# Patient Record
Sex: Female | Born: 1965
Health system: Southern US, Community
[De-identification: ages and names within clinical notes are randomized; demographics above are authoritative.]

## PROBLEM LIST (undated history)

## (undated) DIAGNOSIS — I10 Essential (primary) hypertension: Secondary | ICD-10-CM

## (undated) DIAGNOSIS — Z21 Asymptomatic human immunodeficiency virus [HIV] infection status: Secondary | ICD-10-CM

## (undated) DIAGNOSIS — D649 Anemia, unspecified: Secondary | ICD-10-CM

## (undated) DIAGNOSIS — Z86010 Personal history of colonic polyps: Secondary | ICD-10-CM

## (undated) DIAGNOSIS — B20 Human immunodeficiency virus [HIV] disease: Secondary | ICD-10-CM

## (undated) DIAGNOSIS — E876 Hypokalemia: Secondary | ICD-10-CM

## (undated) DIAGNOSIS — F419 Anxiety disorder, unspecified: Secondary | ICD-10-CM

## (undated) DIAGNOSIS — E079 Disorder of thyroid, unspecified: Secondary | ICD-10-CM

## (undated) DIAGNOSIS — E860 Dehydration: Secondary | ICD-10-CM

## (undated) HISTORY — DX: Disorder of thyroid, unspecified: E07.9

## (undated) HISTORY — DX: Anxiety disorder, unspecified: F41.9

## (undated) HISTORY — DX: Anemia, unspecified: D64.9

## (undated) HISTORY — DX: Essential (primary) hypertension: I10

## (undated) HISTORY — DX: Personal history of colonic polyps: Z86.010

---

## 2000-10-14 ENCOUNTER — Encounter (INDEPENDENT_AMBULATORY_CARE_PROVIDER_SITE_OTHER): Payer: Self-pay

## 2000-10-14 ENCOUNTER — Other Ambulatory Visit: Admission: RE | Admit: 2000-10-14 | Discharge: 2000-10-14 | Payer: Self-pay | Admitting: Obstetrics and Gynecology

## 2001-08-31 ENCOUNTER — Other Ambulatory Visit: Admission: RE | Admit: 2001-08-31 | Discharge: 2001-08-31 | Payer: Self-pay | Admitting: Obstetrics and Gynecology

## 2002-07-27 ENCOUNTER — Emergency Department (HOSPITAL_COMMUNITY): Admission: EM | Admit: 2002-07-27 | Discharge: 2002-07-27 | Payer: Self-pay | Admitting: Emergency Medicine

## 2002-07-27 ENCOUNTER — Encounter: Payer: Self-pay | Admitting: Emergency Medicine

## 2003-08-15 ENCOUNTER — Other Ambulatory Visit: Admission: RE | Admit: 2003-08-15 | Discharge: 2003-08-15 | Payer: Self-pay | Admitting: Obstetrics and Gynecology

## 2004-11-12 ENCOUNTER — Ambulatory Visit: Payer: Self-pay | Admitting: Family Medicine

## 2004-11-12 ENCOUNTER — Other Ambulatory Visit: Admission: RE | Admit: 2004-11-12 | Discharge: 2004-11-12 | Payer: Self-pay | Admitting: Family Medicine

## 2004-11-28 ENCOUNTER — Ambulatory Visit: Payer: Self-pay | Admitting: Family Medicine

## 2004-12-24 ENCOUNTER — Ambulatory Visit: Payer: Self-pay | Admitting: Family Medicine

## 2005-01-09 ENCOUNTER — Ambulatory Visit: Payer: Self-pay | Admitting: Family Medicine

## 2005-06-10 ENCOUNTER — Ambulatory Visit: Payer: Self-pay | Admitting: Family Medicine

## 2005-12-07 ENCOUNTER — Encounter: Payer: Self-pay | Admitting: Family Medicine

## 2005-12-08 ENCOUNTER — Encounter: Admission: RE | Admit: 2005-12-08 | Discharge: 2005-12-08 | Payer: Self-pay | Admitting: Ophthalmology

## 2005-12-09 ENCOUNTER — Encounter: Admission: RE | Admit: 2005-12-09 | Discharge: 2005-12-09 | Payer: Self-pay | Admitting: Occupational Medicine

## 2005-12-19 ENCOUNTER — Ambulatory Visit: Payer: Self-pay | Admitting: Family Medicine

## 2006-01-13 ENCOUNTER — Ambulatory Visit: Payer: Self-pay | Admitting: Family Medicine

## 2006-04-09 ENCOUNTER — Encounter (INDEPENDENT_AMBULATORY_CARE_PROVIDER_SITE_OTHER): Payer: Self-pay | Admitting: Specialist

## 2006-04-09 ENCOUNTER — Inpatient Hospital Stay (HOSPITAL_COMMUNITY): Admission: RE | Admit: 2006-04-09 | Discharge: 2006-04-10 | Payer: Self-pay | Admitting: Obstetrics and Gynecology

## 2006-04-09 HISTORY — PX: ABDOMINAL HYSTERECTOMY: SHX81

## 2006-06-09 LAB — CONVERTED CEMR LAB: Pap Smear: NORMAL

## 2006-07-09 ENCOUNTER — Ambulatory Visit: Payer: Self-pay | Admitting: Family Medicine

## 2006-10-16 ENCOUNTER — Ambulatory Visit: Payer: Self-pay | Admitting: Family Medicine

## 2006-10-16 ENCOUNTER — Encounter: Payer: Self-pay | Admitting: Family Medicine

## 2006-10-16 DIAGNOSIS — L309 Dermatitis, unspecified: Secondary | ICD-10-CM | POA: Insufficient documentation

## 2006-10-16 DIAGNOSIS — E049 Nontoxic goiter, unspecified: Secondary | ICD-10-CM | POA: Insufficient documentation

## 2006-10-16 DIAGNOSIS — D259 Leiomyoma of uterus, unspecified: Secondary | ICD-10-CM | POA: Insufficient documentation

## 2006-10-16 DIAGNOSIS — J309 Allergic rhinitis, unspecified: Secondary | ICD-10-CM | POA: Insufficient documentation

## 2006-10-16 DIAGNOSIS — D509 Iron deficiency anemia, unspecified: Secondary | ICD-10-CM | POA: Insufficient documentation

## 2006-10-16 DIAGNOSIS — F411 Generalized anxiety disorder: Secondary | ICD-10-CM | POA: Insufficient documentation

## 2006-10-16 DIAGNOSIS — J45909 Unspecified asthma, uncomplicated: Secondary | ICD-10-CM | POA: Insufficient documentation

## 2006-10-16 DIAGNOSIS — I10 Essential (primary) hypertension: Secondary | ICD-10-CM | POA: Insufficient documentation

## 2006-10-16 DIAGNOSIS — M722 Plantar fascial fibromatosis: Secondary | ICD-10-CM | POA: Insufficient documentation

## 2006-10-16 DIAGNOSIS — M109 Gout, unspecified: Secondary | ICD-10-CM | POA: Insufficient documentation

## 2006-10-16 DIAGNOSIS — E0500 Thyrotoxicosis with diffuse goiter without thyrotoxic crisis or storm: Secondary | ICD-10-CM | POA: Insufficient documentation

## 2006-12-24 ENCOUNTER — Ambulatory Visit: Payer: Self-pay | Admitting: Family Medicine

## 2006-12-24 DIAGNOSIS — F17200 Nicotine dependence, unspecified, uncomplicated: Secondary | ICD-10-CM | POA: Insufficient documentation

## 2006-12-25 LAB — CONVERTED CEMR LAB
ALT: 11 units/L (ref 0–35)
Albumin: 3.7 g/dL (ref 3.5–5.2)
Basophils Relative: 0.3 % (ref 0.0–1.0)
Calcium: 9.3 mg/dL (ref 8.4–10.5)
Chloride: 103 meq/L (ref 96–112)
Creatinine, Ser: 0.8 mg/dL (ref 0.4–1.2)
Eosinophils Absolute: 0.2 10*3/uL (ref 0.0–0.6)
Glucose, Bld: 63 mg/dL — ABNORMAL LOW (ref 70–99)
HCT: 37.9 % (ref 36.0–46.0)
HDL: 32.1 mg/dL — ABNORMAL LOW (ref >39.0)
MCHC: 34.7 g/dL (ref 30.0–36.0)
Monocytes Absolute: 0.6 10*3/uL (ref 0.2–0.7)
Neutro Abs: 3.4 10*3/uL (ref 1.4–7.7)
RBC: 4.2 M/uL (ref 3.87–5.11)
RDW: 12.9 % (ref 11.5–14.6)
Sodium: 141 meq/L (ref 135–145)
Total CHOL/HDL Ratio: 5
Total Protein: 7.1 g/dL (ref 6.0–8.3)

## 2007-01-05 ENCOUNTER — Telehealth (INDEPENDENT_AMBULATORY_CARE_PROVIDER_SITE_OTHER): Payer: Self-pay | Admitting: *Deleted

## 2007-05-27 ENCOUNTER — Encounter: Payer: Self-pay | Admitting: Family Medicine

## 2008-06-07 ENCOUNTER — Ambulatory Visit: Payer: Self-pay | Admitting: Family Medicine

## 2008-06-07 DIAGNOSIS — E039 Hypothyroidism, unspecified: Secondary | ICD-10-CM | POA: Insufficient documentation

## 2008-06-08 LAB — CONVERTED CEMR LAB
BUN: 13 mg/dL (ref 6–23)
CO2: 30 meq/L (ref 19–32)
Calcium: 9.5 mg/dL (ref 8.4–10.5)
Creatinine, Ser: 0.7 mg/dL (ref 0.4–1.2)
Free T4: 0.7 ng/dL (ref 0.6–1.6)
GFR calc Af Amer: 118 mL/min
GFR calc non Af Amer: 98 mL/min
Sodium: 138 meq/L (ref 135–145)

## 2009-07-13 ENCOUNTER — Encounter: Payer: Self-pay | Admitting: Family Medicine

## 2009-07-13 ENCOUNTER — Emergency Department (HOSPITAL_COMMUNITY): Admission: EM | Admit: 2009-07-13 | Discharge: 2009-07-13 | Payer: Self-pay | Admitting: Emergency Medicine

## 2009-07-18 ENCOUNTER — Ambulatory Visit: Payer: Self-pay | Admitting: Family Medicine

## 2009-07-18 DIAGNOSIS — R002 Palpitations: Secondary | ICD-10-CM | POA: Insufficient documentation

## 2009-07-18 DIAGNOSIS — K3189 Other diseases of stomach and duodenum: Secondary | ICD-10-CM | POA: Insufficient documentation

## 2009-07-18 DIAGNOSIS — R1013 Epigastric pain: Secondary | ICD-10-CM

## 2009-07-19 LAB — CONVERTED CEMR LAB: T3 Uptake Ratio: 36.8 % (ref 22.5–37.0)

## 2009-10-29 ENCOUNTER — Ambulatory Visit: Payer: Self-pay | Admitting: Family Medicine

## 2009-10-29 LAB — CONVERTED CEMR LAB
ALT: 12 units/L (ref 0–35)
Albumin: 4.3 g/dL (ref 3.5–5.2)
Basophils Absolute: 0 10*3/uL (ref 0.0–0.1)
Bilirubin, Direct: 0.1 mg/dL (ref 0.0–0.3)
CO2: 31 meq/L (ref 19–32)
Chloride: 101 meq/L (ref 96–112)
GFR calc non Af Amer: 129.79 mL/min (ref >60)
HCT: 38.8 % (ref 36.0–46.0)
HDL: 42.2 mg/dL (ref >39.00)
Hemoglobin: 13.2 g/dL (ref 12.0–15.0)
Lymphocytes Relative: 25.5 % (ref 12.0–46.0)
MCHC: 34.1 g/dL (ref 30.0–36.0)
MCV: 93 fL (ref 78.0–100.0)
Monocytes Relative: 7.6 % (ref 3.0–12.0)
Neutro Abs: 4.2 10*3/uL (ref 1.4–7.7)
Platelets: 259 10*3/uL (ref 150.0–400.0)
Potassium: 4 meq/L (ref 3.5–5.1)
Sodium: 139 meq/L (ref 135–145)
TSH: 4.82 microintl units/mL (ref 0.35–5.50)
VLDL: 10 mg/dL (ref 0.0–40.0)
WBC: 6.5 10*3/uL (ref 4.5–10.5)

## 2009-10-31 ENCOUNTER — Ambulatory Visit: Payer: Self-pay | Admitting: Family Medicine

## 2010-02-15 ENCOUNTER — Ambulatory Visit: Payer: Self-pay | Admitting: Family Medicine

## 2010-04-10 ENCOUNTER — Encounter: Admission: RE | Admit: 2010-04-10 | Discharge: 2010-04-10 | Payer: Self-pay | Admitting: Family Medicine

## 2010-04-16 ENCOUNTER — Encounter: Payer: Self-pay | Admitting: Family Medicine

## 2010-04-30 ENCOUNTER — Encounter (INDEPENDENT_AMBULATORY_CARE_PROVIDER_SITE_OTHER): Payer: Self-pay | Admitting: *Deleted

## 2010-06-12 ENCOUNTER — Other Ambulatory Visit: Payer: Self-pay | Admitting: Family Medicine

## 2010-06-12 ENCOUNTER — Ambulatory Visit
Admission: RE | Admit: 2010-06-12 | Discharge: 2010-06-12 | Payer: Self-pay | Source: Home / Self Care | Attending: Family Medicine | Admitting: Family Medicine

## 2010-06-12 DIAGNOSIS — E7800 Pure hypercholesterolemia, unspecified: Secondary | ICD-10-CM | POA: Insufficient documentation

## 2010-06-12 LAB — AST: AST: 16 U/L (ref 0–37)

## 2010-06-12 LAB — LIPID PANEL
Cholesterol: 180 mg/dL (ref 0–200)
HDL: 36 mg/dL — ABNORMAL LOW (ref >39.00)
LDL Cholesterol: 133 mg/dL — ABNORMAL HIGH (ref 0–99)
Total CHOL/HDL Ratio: 5
Triglycerides: 54 mg/dL (ref 0.0–149.0)
VLDL: 10.8 mg/dL (ref 0.0–40.0)

## 2010-06-12 LAB — ALT: ALT: 10 U/L (ref 0–35)

## 2010-06-19 ENCOUNTER — Ambulatory Visit
Admission: RE | Admit: 2010-06-19 | Discharge: 2010-06-19 | Payer: Self-pay | Source: Home / Self Care | Attending: Family Medicine | Admitting: Family Medicine

## 2010-06-30 ENCOUNTER — Encounter: Payer: Self-pay | Admitting: Family Medicine

## 2010-07-11 NOTE — Assessment & Plan Note (Signed)
Summary: ?SINUS INFECTION/CLE   Vital Signs:  Patient profile:   45 year old female Height:      66 inches Weight:      212 pounds BMI:     34.34 Temp:     97.9 degrees F oral Pulse rate:   72 / minute Pulse rhythm:   regular BP sitting:   124 / 82  (left arm) Cuff size:   large  Vitals Entered By: Lewanda Rife LPN (February 15, 2010 9:03 AM) CC: ?sinius infection, sinus congested and drainage at back of throat   History of Present Illness: thinks she has a sinus infection some symptoms since may  was exp to a gas at her job for 3 months and started using afrin and generic versions of that for a while  is still using   really congested  is having drainage and some colored phlegm  head pressure and pain in face  some sore throat   no n/v/d   is smoking 1 pk per 4 days  has not tried patches chantix made her depressed    Allergies: 1)  ! Codeine 2)  ! Chantix  Past History:  Past Medical History: Last updated: 07/18/2009 Anemia-iron deficiency Anxiety Asthma Gout Hypertension Hyperthyroidism quit smoking 1/11  Past Surgical History: Last updated: 10/31/2009 Caesarean section Hysterectomy- (04/2006)- partial for fibroids   Family History: Last updated: 12/24/2006 Father: Brain anyerism- deceased Mother: pulmonary embolus, deceased Siblings:  uncle CAD MGM b ca HTN in family DM in distant family brother- lung ca  Social History: Last updated: 10/31/2009 Marital Status: Married Children: 2 daughters Occupation: pt. care tech- dialysis therapist  former smoker age [age] (5/11 -- started smoking again )   Risk Factors: Smoking Status: current (10/16/2006)  Review of Systems General:  Complains of fatigue, fever, and malaise. Eyes:  Denies blurring and eye irritation. ENT:  Complains of nasal congestion, postnasal drainage, sinus pressure, and sore throat. CV:  Denies chest pain or discomfort, lightheadness, and palpitations. Resp:   Complains of cough and sputum productive; denies shortness of breath and wheezing. GI:  Denies abdominal pain, nausea, and vomiting. Derm:  Denies itching, lesion(s), poor wound healing, and rash. Neuro:  Complains of headaches; denies numbness and tingling.  Physical Exam  General:  Well-developed,well-nourished,in no acute distress; alert,appropriate and cooperative throughout examination Head:  normocephalic, atraumatic, and no abnormalities observed.  very mild maxillary sinus tenderness Eyes:  vision grossly intact, pupils equal, pupils round, and pupils reactive to light.  no conjunctival pallor, injection or icterus  Ears:  R ear normal and L ear normal.   Nose:  nares are injected and congested bilaterally  Mouth:  pharynx pink and moist, no erythema, and no exudates.   Neck:  No deformities, masses, or tenderness noted. Chest Wall:  No deformities, masses, or tenderness noted. Lungs:  mildly distant bs without wheeze or prol exp phase  Heart:  Normal rate and regular rhythm. S1 and S2 normal without gallop, murmur, click, rub or other extra sounds. Skin:  Intact without suspicious lesions or rashes Cervical Nodes:  No lymphadenopathy noted Psych:  normal affect, talkative and pleasant    Impression & Recommendations:  Problem # 1:  SINUSITIS - ACUTE-NOS (ICD-461.9) Assessment New  after uri and chem exp and dependence on afrin recommend sympt care- see pt instructions  will stop afrin tx with augmentin and flonase  pt advised to update me if symptoms worsen or do not improve  Her updated medication list  for this problem includes:    Augmentin 875-125 Mg Tabs (Amoxicillin-pot clavulanate) .Marland Kitchen... 1 by mouth two times a day for 10 days    Flonase 50 Mcg/act Susp (Fluticasone propionate) .Marland Kitchen... 2 sprays in each nostril once daily  Orders: Prescription Created Electronically [id number])  Problem # 2:  SMOKER (ICD-305.1) Assessment: Unchanged intent on quitting will try  nicotine patches otc and update discussed in detail risks of smoking, and possible outcomes including COPD, vascular dz, cancer and also respiratory infections/sinus problems  - pt aware   Complete Medication List: 1)  Hydrochlorothiazide 25 Mg Tabs (Hydrochlorothiazide) .... Take one by mouth each am 2)  Omeprazole 20 Mg Cpdr (Omeprazole) .Marland Kitchen.. 1 by mouth once daily in am 3)  Ibuprofen 200 Mg Tabs (Ibuprofen) .... Otc as directed. 4)  Augmentin 875-125 Mg Tabs (Amoxicillin-pot clavulanate) .Marland Kitchen.. 1 by mouth two times a day for 10 days 5)  Flonase 50 Mcg/act Susp (Fluticasone propionate) .... 2 sprays in each nostril once daily  Patient Instructions: 1)  try nicotine patches otc 2)  take augmentin for sinus infection 3)  try flonase for congestion 4)  stop otc nasal sprays completely  5)  you can try mucinex for congestion and drink lots of water 6)  I sent px to pharmacy Prescriptions: FLONASE 50 MCG/ACT SUSP (FLUTICASONE PROPIONATE) 2 sprays in each nostril once daily  #1 mdi x 11   Entered and Authorized by:   Judith Part MD   Signed by:   Judith Part MD on 02/15/2010   Method used:   Electronically to        [facility] (retail)       [address]       [place], [place]  [address]       Ph: [phone]       Fax: [phone]   RxID:   [id number] AUGMENTIN 875-125 MG TABS (AMOXICILLIN-POT CLAVULANATE) 1 by mouth two times a day for 10 days  #20 x 0   Entered and Authorized by:   Judith Part MD   Signed by:   Judith Part MD on 02/15/2010   Method used:   Electronically to        [facility] (retail)       [address]       [place], [place]  [address]       Ph: [phone]       Fax: [phone]   RxID:   [id number]   Current Allergies (reviewed today): ! CODEINE ! CHANTIX

## 2010-07-11 NOTE — Assessment & Plan Note (Signed)
Summary: HEART PALPITATIONS,JITTERY/CLE   Vital Signs:  Patient profile:   45 year old female Height:      66 inches Weight:      212.25 pounds BMI:     34.38 Temp:     98.7 degrees F oral Pulse rate:   72 / minute Pulse rhythm:   regular BP sitting:   122 / 90  (left arm) Cuff size:   large  Vitals Entered By: Lewanda Rife LPN (July 18, 2009 10:21 AM)  History of Present Illness: here for heart palp seen in [facility] on 2/4  around first of january - felt like her K was getting low - so she inc high K foods  heart palpitations -- hr up and down jitters and feeling panicy on the job  extra fatigue - but no hair loss not sleeping / afraid to go to sleep also having chest pains -- like a knawing sensation  when she burps some relief  did eat out 3 times on one weekend -- this may have added to it -- felt horrible   K level and other labs in hosp -- was nl   some sinus problems - after a cold   QUIT SMOKING-- is really happy about that 1 mo   stress  finished her degree 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 Codeine  Past History:  Past Surgical History: Last updated: 10/16/2006 Caesarean section Hysterectomy- (04/2006)  Family History: Last updated: 12/24/2006 Father: Brain anyerism- deceased Mother: pulmonary embolus, deceased Siblings:  uncle CAD MGM b ca HTN in family DM in distant family brother- lung ca  Social History: Last updated: 07/18/2009 Marital Status: Married Children: 2 daughters Occupation: pt. care tech- dialysis therapist  former smoker age [age]   Risk Factors: Smoking Status: current (10/16/2006)  Past Medical History: Anemia-iron deficiency Anxiety Asthma Gout Hypertension Hyperthyroidism quit smoking 1/11  Social  History: Marital Status: Married Children: 2 daughters Occupation: pt. care tech- dialysis therapist  former smoker age [age]   Review of Systems General:  Denies fatigue, fever, loss of appetite, and malaise. Eyes:  Denies blurring and eye irritation. CV:  Complains of palpitations; denies chest pain or discomfort and lightheadness. Resp:  Denies cough and wheezing. GI:  Complains of gas and indigestion; denies abdominal pain, bloody stools, and change in bowel habits. MS:  Denies muscle aches and cramps. Derm:  Denies itching, lesion(s), poor wound healing, and rash. Neuro:  Complains of tremors; denies headaches, numbness, tingling, visual disturbances, and weakness. Psych:  Complains of anxiety; denies sense of great danger and suicidal thoughts/plans. Endo:  Denies cold intolerance, excessive thirst, excessive urination, and heat intolerance. Heme:  Denies abnormal bruising and bleeding.  Physical Exam  General:  overweight but generally well appearing  Head:  normocephalic, atraumatic, and no abnormalities observed.   Eyes:  vision grossly intact, pupils equal, pupils round, pupils reactive to light, and no injection.   Ears:  R ear normal and L ear normal.   Nose:  no nasal discharge.   Mouth:  pharynx pink and moist.   Neck:  nl rom  no bruits or jvd no change in thyroid exam  Chest Wall:  No deformities, masses,  or tenderness noted. Lungs:  Normal respiratory effort, chest expands symmetrically. Lungs are clear to auscultation, no crackles or wheezes. Heart:  Normal rate and regular rhythm. S1 and S2 normal without gallop, murmur, click, rub or other extra sounds. Abdomen:  mild epigastric tenderness without rebound or gaurding  soft, normal bowel sounds, no distention, no masses, no hepatomegaly, and no splenomegaly.   Msk:  No deformity or scoliosis noted of thoracic or lumbar spine.   Extremities:  No clubbing, cyanosis, edema, or deformity noted with normal full  range of motion of all joints.   Neurologic:  sensation intact to light touch, gait normal, and DTRs symmetrical and normal.  no tremor Skin:  Intact without suspicious lesions or rashes Cervical Nodes:  No lymphadenopathy noted Psych:  mildly anxious  nl eye contact and communication skils    Impression & Recommendations:  Problem # 1:  PALPITATIONS (ICD-785.1) Assessment New suspect multifactorial - with anxiety / stress playing a role as well as caffiene rev lab and hosp records  check thyroid - hx of graves dz  tx dyspepsia stop caffiene f/u 1 mo  i fworse- cardiol consult for monitor  Orders: Venipuncture (54098) TLB-TSH (Thyroid Stimulating Hormone) (84443-TSH) TLB-T3 Uptake (84479-T3UP) TLB-T4 (Thyrox), Free [phone])  Problem # 2:  UNSPECIFIED HYPOTHYROIDISM (ICD-244.9) Assessment: Unchanged labs today- hx of graves  Orders: Venipuncture (56213) TLB-TSH (Thyroid Stimulating Hormone) (84443-TSH) TLB-T3 Uptake (84479-T3UP) TLB-T4 (Thyrox), Free (08657-QI6N)  Problem # 3:  DYSPEPSIA (ICD-536.8) Assessment: New with epigastric and chest pain tx with omeprazole diet change / d/c caffiene  f/u 1 mo / earlier if worse  Complete Medication List: 1)  Hydrochlorothiazide 25 Mg Tabs (Hydrochlorothiazide) .... Take one by mouth each am 2)  Omeprazole 20 Mg Cpdr (Omeprazole) .Marland Kitchen.. 1 by mouth once daily in am  Patient Instructions: 1)  start omeprazole 20 mg one pill daily in am (but take first dose now)  2)  avoid spicy food and caffine  3)  gradually cut caffine to quit it  4)  no anti inflammatory meds - ibuprofen or aleve or naproxen (tylenol is ok ) 5)  only take ultram when you really need it  6)  thyroid labs today  7)  follow up with me for your physical as planned  8)  if palpitations in meantime worsen - update me  Prescriptions: HYDROCHLOROTHIAZIDE 25 MG TABS (HYDROCHLOROTHIAZIDE) Take one by mouth each am  #30 x 1   Entered and Authorized by:   Judith Part MD   Signed by:   Judith Part MD on 07/18/2009   Method used:   Print then Give to Patient   RxID:   [id number] OMEPRAZOLE 20 MG CPDR (OMEPRAZOLE) 1 by mouth once daily in am  #30 x 11   Entered and Authorized by:   Judith Part MD   Signed by:   Judith Part MD on 07/18/2009   Method used:   Print then Give to Patient   RxID:   [id number]   Current Allergies (reviewed today): ! CODEINE

## 2010-07-11 NOTE — Assessment & Plan Note (Signed)
Summary: 6 month FU after labs/mk   Vital Signs:  Patient profile:   45 year old female Height:      66 inches Weight:      220.25 pounds BMI:     35.68 Temp:     98.1 degrees F oral Pulse rate:   76 / minute Pulse rhythm:   regular BP sitting:   110 / 72  (left arm) Cuff size:   large  Vitals Entered By: Lewanda Rife LPN (June 19, 2010 2:09 PM) CC: six month f/u after labs   History of Present Illness: here for f/u of HTN and hyperlipidemia  is doing pretty good overall  needs f/u with endo - is interested in seeing Dr Gerrit Friends   thinks her graves dz is flaring back up on her  itchy dry skin on legs   wt is up 8 lb from last visit --bmi if 35  110/72- good bp   lipids are down a bit trig 54 and HDL 36 (was 42) andLDL 133 from 141  diet  has been eating much lower fat diet  eats right with her daughter  no fried food or red meat - stays away from fatty food and shellfish   interested in chantix for smoking cessation -- wants to try it again  may want to try   no regular exercise--has the equiptment  can start today     had very busy holiday   Allergies: 1)  ! Codeine 2)  ! 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 Eyes:  Denies blurring and eye irritation. CV:  Denies chest pain or discomfort, palpitations, shortness of breath with exertion, and swelling of feet. Resp:  Denies cough, shortness of breath, and wheezing. GI:  Denies indigestion and nausea. GU:  Denies urinary frequency. Derm:  Complains of itching and lesion(s). Neuro:  Denies numbness and tingling. Psych:  mood is ok . Endo:  Denies cold intolerance, excessive thirst, excessive urination, and heat intolerance. Heme:  Denies abnormal bruising and bleeding.  Physical Exam  General:  overweight but generally well appearing  Head:  normocephalic, atraumatic, and no abnormalities observed.  very mild maxillary sinus tenderness Eyes:  vision grossly intact, pupils equal, pupils round, and pupils reactive to light.  no conjunctival pallor, injection or icterus  Nose:  no nasal discharge.   Mouth:  pharynx pink and moist.   Neck:  supple with full rom and no masses or thyromegally, no JVD or carotid bruit  thyroid is prominent  Lungs:  mildly distant bs without wheeze or prol exp phase  Heart:  Normal rate and regular rhythm. S1 and S2 normal without gallop, murmur, click, rub or other extra sounds. Abdomen:  Bowel sounds positive,abdomen soft and non-tender without masses, organomegaly or hernias noted. no renal bruits  Msk:  No deformity or scoliosis noted of thoracic  or lumbar spine.  no acute joint changes  Pulses:  R and L carotid,radial,femoral,dorsalis pedis and posterior tibial pulses are full and equal bilaterally Extremities:  No clubbing, cyanosis, edema, or deformity noted with normal full range of motion of all joints.   Neurologic:  sensation intact to light touch, gait normal, and DTRs symmetrical and normal.  no tremor  Skin:  dry skin on legs- excoriations  Cervical Nodes:  No lymphadenopathy noted Psych:  normal affect, talkative and pleasant    Impression & Recommendations:  Problem # 1:   HYPERCHOLESTEROLEMIA (ICD-272.0) Assessment Improved  this is improving with diet  goal LDL is 130 or below if she quits smoking -- 100 or below if she does not disc risks in detail  will check next in summer for PE rev low sat fat diet will start exercise   Orders: Prescription Created Electronically [phone])  Problem # 2:  SMOKER (ICD-305.1) Assessment: Unchanged  given chantix again to try  thinks in retrospect the "side eff" were not really from the med will keep me updated  Her updated medication list for this problem includes:    Chantix Starting Month Pak 0.5 Mg X 11 & 1 Mg X 42 Tabs (Varenicline tartrate) .Marland Kitchen... Take by mouth as directed    Chantix 1 Mg Tabs (Varenicline tartrate) .Marland Kitchen... 1 by mouth two times a day after finishing starter pack  Orders: Prescription Created Electronically [phone])  Problem # 3:  Hx of GRAVE'S DISEASE (ICD-242.00) Assessment: Deteriorated having more skin problems again and wants to see endo ref done Orders: Endocrinology Referral (Endocrine) Prescription Created Electronically [phone])  Complete Medication List: 1)  Hydrochlorothiazide 25 Mg Tabs (Hydrochlorothiazide) .... Take one by mouth each am 2)  Omeprazole 20 Mg Cpdr (Omeprazole) .Marland Kitchen.. 1 by mouth once daily in am 3)  Ibuprofen 200 Mg Tabs (Ibuprofen) .... Otc as directed. 4)  Flonase 50 Mcg/act Susp (Fluticasone propionate) .... 2 sprays in each nostril once daily as needed 5)  Chantix Starting Month Pak 0.5 Mg X 11 & 1 Mg X 42 Tabs (Varenicline tartrate) .... Take by mouth as directed 6)  Chantix 1 Mg Tabs (Varenicline tartrate) .Marland Kitchen.. 1 by mouth two times a day after finishing starter pack  Patient Instructions: 1)  we will do endocrine referral at check out  2)  cholesterol is improved - keep working on it  3)  work on quitting smoking -- try the chantix and stop it if side effects  4)  It is important that you exercise reguarly at least 20 minutes 5 times a week. If you develop  chest pain, have severe difficulty breathing, or feel very tired, stop exercising immediately and seek medical attention.  5)  schedule PE in summer with labs 1 week prior  Prescriptions: OMEPRAZOLE 20 MG CPDR (OMEPRAZOLE) 1 by mouth once daily in am  #30 x 11   Entered and Authorized by:   Judith Part MD   Signed by:   Judith Part MD on 06/19/2010   Method used:   Electronically to        [facility] (retail)       [address]       [place], [place]  [address]       Ph: [phone]       Fax: [phone]   RxID:   [id number] CHANTIX 1 MG TABS (VARENICLINE TARTRATE) 1 by mouth two times a day  after finishing starter pack  #60 x 3   Entered and Authorized by:   Judith Part MD   Signed by:   Judith Part MD on 06/19/2010   Method used:   Electronically to        [facility] (retail)       [address]       [place], [place]  [address]       Ph: [phone]       Fax: [phone]   RxID:   [id number] CHANTIX STARTING MONTH PAK 0.5 MG X 11 & 1 MG X 42 TABS (VARENICLINE TARTRATE) take by mouth as directed  #1 pack x 0   Entered and Authorized by:   Judith Part MD   Signed by:   Judith Part MD on 06/19/2010   Method used:   Electronically to        [facility] (retail)       [address]       [place], [place]  [address]       Ph: [phone]       Fax: [phone]   RxID:   [id number]    Orders Added: 1)  Endocrinology Referral [Endocrine] 2)  Prescription Created Electronically [G8553] 3)  Est. Patient Level IV [[address]]    Current Allergies (reviewed today): ! CODEINE ! CHANTIX

## 2010-07-11 NOTE — Letter (Signed)
Summary: Results Follow up Letter  [facility]  [address] [place], [place] [address]   Phone: [phone]  Fax: [phone]    04/16/2010 MRN: [id number]    CHELCEY CAPUTO [address] [place], [place]  [address]    Dear Ms. Vega,  The following are the results of your recent test(s):  Test         Result    Pap Smear:        Normal _____  Not Normal _____ Comments: ______________________________________________________ Cholesterol: LDL(Bad cholesterol):         Your goal is less than:         HDL (Good cholesterol):       Your goal is more than: Comments:  ______________________________________________________ Mammogram:        Normal __X___  Not Normal _____ Comments:Repeat in one year.   ___________________________________________________________________ Hemoccult:        Normal _____  Not normal _______ Comments:    _____________________________________________________________________ Other Tests:    We routinely do not discuss normal results over the telephone.  If you desire a copy of the results, or you have any questions about this information we can discuss them at your next office visit.   Sincerely,    Idamae Schuller Tower,MD  [place]/ri

## 2010-07-11 NOTE — Assessment & Plan Note (Signed)
Summary: cpx   Vital Signs:  Patient profile:   45 year old female Height:      66 inches Weight:      210.25 pounds BMI:     34.06 Temp:     98.1 degrees F oral Pulse rate:   76 / minute Pulse rhythm:   regular BP sitting:   110 / 76  (left arm) Cuff size:   large  Vitals Entered By: Lewanda Rife LPN (Oct 31, 2009 10:47 AM) CC: CPX LMP partial hyst 2006   History of Present Illness: here for health mt exam   feels fine nothing new     wt is down 2 lb--bmi is 34  hx of anemia- this is resolved   HTN is well controlled with bp of 110/76 today   labs all nl except chol  chol is up with trig 50 and HDL 42 and LDL 141 (up from the 120s) no fam hx high chol  diet has been slack lately with fatty foods  no red meat - except ground beef   hyst in past - partial for fibroids  no gyn probs - no exam today   mam 9/07  - needs one - will sched self exam -no lumps / breasts get tender before menses   Td 06  getting some exercise -- 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 Codeine  Past History:  Past Medical History: Last updated: 07/18/2009 Anemia-iron deficiency Anxiety Asthma Gout Hypertension Hyperthyroidism quit smoking 1/11  Family History: Last updated: 12/24/2006 Father: Brain anyerism- deceased Mother: pulmonary embolus, deceased Siblings:  uncle CAD MGM b ca HTN in family DM in distant family brother- lung ca  Social History: Last updated: 10/31/2009 Marital Status: Married Children: 2 daughters Occupation: pt. care tech- dialysis therapist  former smoker age [age] (5/11 -- started smoking again )   Risk Factors: Smoking Status: current (10/16/2006)  Past Surgical History: Caesarean section Hysterectomy- (04/2006)- partial for fibroids   Social History: Marital Status: Married Children: 2  daughters Occupation: pt. care tech- dialysis therapist  former smoker age [age] (5/11 -- started smoking again )   Review of Systems General:  Denies fatigue, fever, loss of appetite, and malaise. Eyes:  Denies blurring and eye irritation. CV:  Denies chest pain or discomfort, lightheadness, and palpitations. Resp:  Denies cough, shortness of breath, and wheezing. GI:  Denies abdominal pain, change in bowel habits, indigestion, and nausea. MS:  Denies joint pain, joint redness, joint swelling, and cramps. Derm:  Denies itching, lesion(s), poor wound healing, and rash. Neuro:  Complains of tremors; occ tremor - worse if hungry or if K gets low imp by OJ . Psych:  Denies anxiety and depression. Endo:  Denies excessive thirst and excessive urination. Heme:  Denies abnormal bruising and bleeding.  Physical Exam  General:  overweight but generally well appearing  Head:  normocephalic, atraumatic, and no abnormalities observed.   Eyes:  vision grossly intact, pupils equal, pupils round, pupils reactive to light, and no injection.   Ears:  R ear normal and L ear normal.   Nose:  no nasal discharge.   Mouth:  pharynx pink and moist.   Neck:  nl rom  no bruits or jvd no change in thyroid exam  Chest Wall:  No deformities, masses, or tenderness noted. Breasts:  No  mass, nodules, thickening, tenderness, bulging, retraction, inflamation, nipple discharge or skin changes noted.   Lungs:  mildly distant bs without wheeze or prol exp phase  Heart:  Normal rate and regular rhythm. S1 and S2 normal without gallop, murmur, click, rub or other extra sounds. Abdomen:  Bowel sounds positive,abdomen soft and non-tender without masses, organomegaly or hernias noted. Msk:  No deformity or scoliosis noted of thoracic or lumbar spine.  no acute joint changes  Pulses:  R and L carotid,radial,femoral,dorsalis pedis and posterior tibial pulses are full and equal bilaterally Extremities:  No clubbing,  cyanosis, edema, or deformity noted with normal full range of motion of all joints.   Neurologic:  sensation intact to light touch, gait normal, and DTRs symmetrical and normal.  no tremor  Skin:  Intact without suspicious lesions or rashes few scabs on legs  Cervical Nodes:  No lymphadenopathy noted Axillary Nodes:  No palpable lymphadenopathy Inguinal Nodes:  No significant adenopathy Psych:  normal affect, talkative and pleasant    Impression & Recommendations:  Problem # 1:  HEALTH MAINTENANCE EXAM (ICD-V70.0) Assessment Comment Only reviewed health habits including diet, exercise and skin cancer prevention reviewed health maintenance list and family history rev labs in detail today stressed imp of smoking cess   Problem # 2:  Hx of GRAVE'S DISEASE (ICD-242.00) Assessment: Unchanged urged f/u with Dr Patrecia Pace if tremors re- occur tsh is nl today  no change in exam   Problem # 3:  HYPERTENSION (ICD-401.9) Assessment: Unchanged  bp is in good control with hctz  no change in med  disc diet and exercise and smoking cessation rev labs today Her updated medication list for this problem includes:    Hydrochlorothiazide 25 Mg Tabs (Hydrochlorothiazide) .Marland Kitchen... Take one by mouth each am  BP today: 110/76 Prior BP: 122/90 (07/18/2009)  Labs Reviewed: K+: 4.0 (10/29/2009) Creat: : 0.6 (10/29/2009)   Chol: 193 (10/29/2009)   HDL: 42.20 (10/29/2009)   LDL: 141 (10/29/2009)   TG: 50.0 (10/29/2009)  Problem # 4:  SMOKER (ICD-305.1) Assessment: Deteriorated disc imp of trying to quit again  given ph number for quit line and also for [place] class has failed wellbutrin and chantix  gave my ok for nicotine repl products  pt declines pneumovax today  Problem # 5:  OTHER SCREENING MAMMOGRAM (ICD-V76.12) Assessment: Comment Only annual mammogram scheduled adv pt to continue regular self breast exams non remarkable breast exam today  Orders: Radiology Referral  (Radiology)  Complete Medication List: 1)  Hydrochlorothiazide 25 Mg Tabs (Hydrochlorothiazide) .... Take one by mouth each am 2)  Omeprazole 20 Mg Cpdr (Omeprazole) .Marland Kitchen.. 1 by mouth once daily in am 3)  Ibuprofen 200 Mg Tabs (Ibuprofen) .... Otc as directed.  Preventive Care Screening  Contraindications of Treatment or Deferment of Test/Procedure:    Test/Procedure: Pneumovax vaccine    Reason for deferment: patient declined   Patient Instructions: 1)  you can raise your HDL (good cholesterol) by increasing exercise and eating omega 3 fatty acid supplement like fish oil or flax seed oil over the counter 2)  you can lower LDL (bad cholesterol) by limiting saturated fats in diet like red meat, fried foods, egg yolks, fatty breakfast meats, high fat dairy products and shellfish  3)  think hard about quitting smoking - try the quit line or class 4)  also nicotine replacement products otc are fine 5)  schedule fasting labs in 6 months lipid/ast/alt 272 - then follow up  6)  will sched  mammogram at check out   Current Allergies (reviewed today): ! CODEINE   Preventive Care Screening  Contraindications of Treatment or Deferment of Test/Procedure:    Test/Procedure: Pneumovax vaccine    Reason for deferment: patient declined

## 2010-07-11 NOTE — Letter (Signed)
Summary: [place] No Show Letter  [facility]  [address] [place], [place] [address]   Phone: [phone]  Fax: [phone]    04/30/2010 MRN: [id number]  Carla Thomas [address] [place], [place]  [address]   Dear Ms. Smolinski,   Our records indicate that you missed your scheduled appointment with ____LAB_________________ on __11.22.11__________.  Please contact this office to reschedule your appointment as soon as possible.  It is important that you keep your scheduled appointments with your physician, so we can provide you the best care possible.  Please be advised that there may be a charge for "no show" appointments.    Sincerely,   [place] [facility]

## 2010-07-16 ENCOUNTER — Encounter: Payer: Self-pay | Admitting: Family Medicine

## 2010-08-06 NOTE — Consult Note (Signed)
Summary: [facility]  [facility]   Imported By: Lanelle Bal 07/30/2010 13:18:42  _____________________________________________________________________  External Attachment:    Type:   Image     Comment:   External Document

## 2010-08-08 ENCOUNTER — Encounter: Payer: Self-pay | Admitting: Family Medicine

## 2010-08-16 ENCOUNTER — Encounter: Payer: Self-pay | Admitting: Family Medicine

## 2010-08-27 NOTE — Letter (Signed)
Summary: [facility]  [facility]   Imported By: Lanelle Bal 08/22/2010 12:31:11  _____________________________________________________________________  External Attachment:    Type:   Image     Comment:   External Document

## 2010-08-29 LAB — POCT CARDIAC MARKERS
CKMB, poc: <1 ng/mL — ABNORMAL LOW (ref 1.0–8.0)
Troponin i, poc: <0.05 ng/mL (ref 0.00–0.09)

## 2010-08-29 LAB — DIFFERENTIAL
Basophils Absolute: 0 10*3/uL (ref 0.0–0.1)
Basophils Relative: 0 % (ref 0–1)
Eosinophils Absolute: 0.2 10*3/uL (ref 0.0–0.7)
Lymphocytes Relative: 29 % (ref 12–46)
Lymphs Abs: 2 10*3/uL (ref 0.7–4.0)
Neutro Abs: 4.3 10*3/uL (ref 1.7–7.7)
Neutrophils Relative %: 61 % (ref 43–77)

## 2010-08-29 LAB — CBC
Hemoglobin: 13.5 g/dL (ref 12.0–15.0)
MCHC: 34.2 g/dL (ref 30.0–36.0)
MCV: 93.2 fL (ref 78.0–100.0)
Platelets: 240 10*3/uL (ref 150–400)
RBC: 4.24 MIL/uL (ref 3.87–5.11)
RDW: 13.3 % (ref 11.5–15.5)

## 2010-08-29 LAB — BASIC METABOLIC PANEL: Sodium: 138 mEq/L (ref 135–145)

## 2010-10-25 NOTE — Letter (Signed)
July 09, 2006     RE:  ALFHILD, PARTCH  MRN:  [id number]  /  DOB:  Jun 22, 1965   To whom it may concern:   Carla Thomas is my patient.  She is having some pain problems  following surgery in November and needs to be restricted to 50 hour or  less work week for the next 4 months.  If you have any questions, feel  free to contact me.    Sincerely,      Marne A. Tower, MD  Electronically Signed    MAT/MedQ  DD: 07/09/2006  DT: 07/09/2006  Job #: [id number]

## 2010-10-25 NOTE — Op Note (Signed)
NAME:  Carla Thomas, Carla Thomas            ACCOUNT NO.:  [id number]   MEDICAL RECORD NO.:  [id number]          PATIENT TYPE:  AMB   LOCATION:  SDC                           FACILITY:  [facility]   PHYSICIAN:  Michelle L. Grewal, M.D.DATE OF BIRTH:  11-27-1965   DATE OF PROCEDURE:  04/09/2006  DATE OF DISCHARGE:                                 OPERATIVE REPORT   PREOPERATIVE DIAGNOSIS:  Pelvic pain and menorrhagia.   POSTOP DIAGNOSIS:  Pelvic pain and menorrhagia, possible adenomyosis.   PROCEDURE:  Laparoscopic-assisted vaginal hysterectomy.   SURGEON:  Dr. Vincente Poli   ASSISTANT:  Dr. Jennette Kettle.   ANESTHESIA:  Is general.   SPECIMENS:  Uterus and cervix.   ESTIMATED BLOOD LOSS:  400 mL.   COMPLICATIONS:  None.   DESCRIPTION OF PROCEDURE:  The patient taken to the operating room.  She was  intubated.  She was prepped and draped in the usual sterile fashion.  A  Foley catheter was inserted and draining clear urine.  The uterine  manipulator was inserted.  Attention was turned to the abdomen and a small  infraumbilical incision was made with a scalpel and the Veress needle was  inserted.  Pneumoperitoneum was performed. The Veress needle was removed and  the 11 mm trocar was inserted.  The patient was placed in Trendelenburg  position. Laparoscope was introduced into the abdominal cavity and a 5 mm  trocar was placed suprapubically under direct visualization. Exam of the  ovaries were normal.  There was no evidence of endometriosis or pelvic  adhesions.  The uterus grossly appeared consistent with adenomyosis. Using  an atraumatic grasper the right ovary was lifted up and using the gyrus  instrument it was placed across the triple pedicle and was burned and cut.  This was carried down to the round ligament on the right side with excellent  hemostasis.  This was done on the left side in a similar fashion without any  difficulty.  We then turned our attention to the vagina. Weighted speculum  was  placed in the vagina.  A circumferential incision was made around the  cervix and the posterior cul-de-sac was entered sharply using Mayo scissors.  The anterior cul-de-sac was entered sharply using Mayo scissors as well.  Using the curved Heaney clamps it was used to clamp the uterosacral cardinal  ligaments just beside cervix.  Each pedicle was suture ligated using 0  Vicryl suture.  We walked our way up the broad ligament. Each pedicle was  secured using a suture ligature of 0 Vicryl suture.  Once we reached the  triple pedicle.  The uterus was then removed. Figure-of-eights were placed  at 3 o'clock and 9 o'clock on the cuff using 0 Vicryl suture and the  posterior cuff was closed in a running stitch using 0 Vicryl in a running  stitch.  The cuff was then closed completely from anterior to posterior  using 0 Vicryl continuous running locked stitch. At this point we went back  up to the abdomen.  Pneumoperitoneum was performed again and the pelvis was  thoroughly irrigated.  No bleeding was noted at  all. Hemostasis was  excellent.  The urine was clear.  We then released the pneumoperitoneum, removed the trocars, closed the  incisions using interrupteds using 3-0 Vicryl. All sponge, lap and  instrument counts were correct x2.  The patient went to recovery room  extubated in stable condition.      Michelle L. Vincente Poli, M.D.  Electronically Signed     MLG/MEDQ  D:  04/09/2006  T:  04/09/2006  Job:  [id number]

## 2010-11-02 ENCOUNTER — Emergency Department (HOSPITAL_COMMUNITY)
Admission: EM | Admit: 2010-11-02 | Discharge: 2010-11-03 | Disposition: A | Discharge status: Home / Self Care | Payer: BC Managed Care – PPO | Attending: Emergency Medicine | Admitting: Emergency Medicine

## 2010-11-02 DIAGNOSIS — R509 Fever, unspecified: Secondary | ICD-10-CM | POA: Insufficient documentation

## 2010-11-02 DIAGNOSIS — E0590 Thyrotoxicosis, unspecified without thyrotoxic crisis or storm: Secondary | ICD-10-CM | POA: Insufficient documentation

## 2010-11-02 DIAGNOSIS — I10 Essential (primary) hypertension: Secondary | ICD-10-CM | POA: Insufficient documentation

## 2010-11-02 DIAGNOSIS — K859 Acute pancreatitis without necrosis or infection, unspecified: Secondary | ICD-10-CM | POA: Insufficient documentation

## 2010-11-02 DIAGNOSIS — E876 Hypokalemia: Secondary | ICD-10-CM | POA: Insufficient documentation

## 2010-11-02 DIAGNOSIS — R1011 Right upper quadrant pain: Secondary | ICD-10-CM | POA: Insufficient documentation

## 2010-11-02 DIAGNOSIS — D72819 Decreased white blood cell count, unspecified: Secondary | ICD-10-CM | POA: Insufficient documentation

## 2010-11-02 DIAGNOSIS — R1013 Epigastric pain: Secondary | ICD-10-CM | POA: Insufficient documentation

## 2010-11-02 DIAGNOSIS — R748 Abnormal levels of other serum enzymes: Secondary | ICD-10-CM | POA: Insufficient documentation

## 2010-11-02 LAB — DIFFERENTIAL
Basophils Absolute: 0 10*3/uL (ref 0.0–0.1)
Basophils Relative: 2 % — ABNORMAL HIGH (ref 0–1)
Eosinophils Relative: 0 % (ref 0–5)
Lymphs Abs: 1.7 10*3/uL (ref 0.7–4.0)

## 2010-11-02 LAB — URINALYSIS, ROUTINE W REFLEX MICROSCOPIC
Hgb urine dipstick: NEGATIVE
Ketones, ur: NEGATIVE mg/dL
Nitrite: NEGATIVE
Protein, ur: 30 mg/dL — AB
Specific Gravity, Urine: 1.014 (ref 1.005–1.030)

## 2010-11-02 LAB — CBC
HCT: 40.1 % (ref 36.0–46.0)
Hemoglobin: 13.8 g/dL (ref 12.0–15.0)
MCH: 29.9 pg (ref 26.0–34.0)
MCHC: 34.4 g/dL (ref 30.0–36.0)

## 2010-11-02 LAB — BASIC METABOLIC PANEL
BUN: 6 mg/dL (ref 6–23)
CO2: 26 mEq/L (ref 19–32)
Calcium: 7.9 mg/dL — ABNORMAL LOW (ref 8.4–10.5)
Creatinine, Ser: 0.61 mg/dL (ref 0.4–1.2)
Potassium: 2.7 mEq/L — CL (ref 3.5–5.1)
Sodium: 133 mEq/L — ABNORMAL LOW (ref 135–145)

## 2010-11-02 LAB — URINE MICROSCOPIC-ADD ON

## 2010-11-03 ENCOUNTER — Emergency Department (HOSPITAL_COMMUNITY): Payer: BC Managed Care – PPO

## 2010-11-03 LAB — HEPATIC FUNCTION PANEL
ALT: 17 U/L (ref 0–35)
Bilirubin, Direct: <0.1 mg/dL (ref 0.0–0.3)

## 2010-11-03 LAB — LIPASE, BLOOD: Lipase: 118 U/L — ABNORMAL HIGH (ref 11–59)

## 2010-11-03 MED ORDER — IOHEXOL 300 MG/ML  SOLN
125.0000 mL | Freq: Once | INTRAMUSCULAR | Status: AC | PRN
  Administered 2010-11-03: 125 mL via INTRAVENOUS

## 2010-11-04 ENCOUNTER — Telehealth: Payer: Self-pay | Admitting: Family Medicine

## 2010-11-04 ENCOUNTER — Encounter: Payer: Self-pay | Admitting: Family Medicine

## 2010-11-04 DIAGNOSIS — E039 Hypothyroidism, unspecified: Secondary | ICD-10-CM

## 2010-11-04 DIAGNOSIS — Z0000 Encounter for general adult medical examination without abnormal findings: Secondary | ICD-10-CM

## 2010-11-04 DIAGNOSIS — E7800 Pure hypercholesterolemia, unspecified: Secondary | ICD-10-CM

## 2010-11-04 LAB — URINE CULTURE

## 2010-11-04 NOTE — Telephone Encounter (Signed)
Message copied by Judy Pimple on Mon Nov 04, 2010  4:54 PM ------      Message from: Baldomero Lamy      Created: Wed Oct 30, 2010  7:34 AM      Regarding: Cpx labs tues       Please order  future cpx labs for pt's upcomming lab appt.      Thanks      Rodney Booze

## 2010-11-05 ENCOUNTER — Other Ambulatory Visit: Payer: Self-pay

## 2010-11-07 ENCOUNTER — Encounter: Payer: BC Managed Care – PPO | Admitting: Family Medicine

## 2010-11-07 DIAGNOSIS — E876 Hypokalemia: Secondary | ICD-10-CM | POA: Insufficient documentation

## 2010-11-07 DIAGNOSIS — D72819 Decreased white blood cell count, unspecified: Secondary | ICD-10-CM | POA: Insufficient documentation

## 2010-11-07 DIAGNOSIS — K859 Acute pancreatitis without necrosis or infection, unspecified: Secondary | ICD-10-CM | POA: Insufficient documentation

## 2010-11-07 NOTE — Progress Notes (Signed)
Cancelled.  

## 2010-11-11 NOTE — Telephone Encounter (Signed)
Addended by: Baldomero Lamy on: 11/11/2010 09:41 AM   Modules accepted: Orders

## 2010-11-12 ENCOUNTER — Ambulatory Visit (INDEPENDENT_AMBULATORY_CARE_PROVIDER_SITE_OTHER): Payer: BC Managed Care – PPO | Admitting: Family Medicine

## 2010-11-12 ENCOUNTER — Encounter: Payer: Self-pay | Admitting: Family Medicine

## 2010-11-12 DIAGNOSIS — Z1231 Encounter for screening mammogram for malignant neoplasm of breast: Secondary | ICD-10-CM

## 2010-11-12 DIAGNOSIS — I10 Essential (primary) hypertension: Secondary | ICD-10-CM

## 2010-11-12 DIAGNOSIS — E0500 Thyrotoxicosis with diffuse goiter without thyrotoxic crisis or storm: Secondary | ICD-10-CM

## 2010-11-12 DIAGNOSIS — E7800 Pure hypercholesterolemia, unspecified: Secondary | ICD-10-CM

## 2010-11-12 DIAGNOSIS — Z0000 Encounter for general adult medical examination without abnormal findings: Secondary | ICD-10-CM

## 2010-11-12 DIAGNOSIS — E876 Hypokalemia: Secondary | ICD-10-CM

## 2010-11-12 DIAGNOSIS — K859 Acute pancreatitis without necrosis or infection, unspecified: Secondary | ICD-10-CM

## 2010-11-12 LAB — CBC WITH DIFFERENTIAL/PLATELET
Basophils Absolute: 0 10*3/uL (ref 0.0–0.1)
Basophils Relative: 0.4 % (ref 0.0–3.0)
Eosinophils Absolute: 0 10*3/uL (ref 0.0–0.7)
Lymphocytes Relative: 54.2 % — ABNORMAL HIGH (ref 12.0–46.0)
MCHC: 34.2 g/dL (ref 30.0–36.0)
MCV: 91 fl (ref 78.0–100.0)
Monocytes Absolute: 0.9 10*3/uL (ref 0.1–1.0)
Neutrophils Relative %: 29.1 % — ABNORMAL LOW (ref 43.0–77.0)
RDW: 12.8 % (ref 11.5–14.6)

## 2010-11-12 LAB — LIPID PANEL
Cholesterol: 157 mg/dL (ref 0–200)
LDL Cholesterol: 105 mg/dL — ABNORMAL HIGH (ref 0–99)
Total CHOL/HDL Ratio: 4
Triglycerides: 87 mg/dL (ref 0.0–149.0)
VLDL: 17.4 mg/dL (ref 0.0–40.0)

## 2010-11-12 LAB — COMPREHENSIVE METABOLIC PANEL
ALT: 45 U/L — ABNORMAL HIGH (ref 0–35)
AST: 37 U/L (ref 0–37)
Albumin: 3.9 g/dL (ref 3.5–5.2)
Alkaline Phosphatase: 39 U/L (ref 39–117)
BUN: 15 mg/dL (ref 6–23)
Calcium: 9.1 mg/dL (ref 8.4–10.5)
Chloride: 104 mEq/L (ref 96–112)
Creatinine, Ser: 0.7 mg/dL (ref 0.4–1.2)
Potassium: 4.1 mEq/L (ref 3.5–5.1)

## 2010-11-12 NOTE — Assessment & Plan Note (Signed)
bp in good control No change in meds Lab today K was low in hosp - and was supplemented

## 2010-11-12 NOTE — Assessment & Plan Note (Signed)
In hosp  Re check today after suppl Was 2.7

## 2010-11-12 NOTE — Assessment & Plan Note (Signed)
Per pt - recent assessment stable tsh with wellness labs today

## 2010-11-12 NOTE — Assessment & Plan Note (Signed)
In hosp with abd pain and elevated lipase ? slt dil panc duct -otherwise clear Check lipase again today No symptoms at all

## 2010-11-12 NOTE — Patient Instructions (Signed)
Labs today Glad you are feeling better  Get to the gym - for regular exercise  We will schedule your mammogram at check out  Eat a low saturated fat diet  Make sure you drink enough water

## 2010-11-12 NOTE — Progress Notes (Signed)
Subjective:    Patient ID: Carla Thomas, female    DOB: 04/14/1966, 45 y.o.   MRN: [id number]  HPI Here for health mt exam and to review chronic medical problems  Pt was recently in hosp for suspected pancreatitis (not a drinker and no gallstones) Had CT abd pelvis Elevated lipase Low wbc and low K Is feeling better No more abdominal pain  Got 2 L of fluid and K  Needs lab re checked  She thinks it was from all the meds she took   Had rxn to amox from oral sx in April- broke out in rash  That got better Had oral surgery  Then sinus problems- and took otc nasal spray ( ? Brand ) Fever/ then motrin / then cold sympotms - took nyquil   Gyn-- had hyst for fibroids partial  No symptoms    HTN -- bp good on hctz 110/76 No cp or ha or edema   Former smoker-- still quit   Grave's dz-- that is ok - had recent follow up   Wt down 10 lb with bmi of 33  Lost that when she was sick- plans to keep it off   Mam was about a year ago ?  Self exam  No lumps or changes   Patient Active Problem List  Diagnoses  . FIBROIDS, UTERUS  . GOITER  . GRAVE'S DISEASE  . GOUT  . ANEMIA-IRON DEFICIENCY  . ANXIETY  . HYPERTENSION  . RHINITIS, ALLERGIC NEC  . ASTHMA  . DYSPEPSIA  . ECZEMA  . PLANTAR FASCIITIS  . PALPITATIONS  . HYPERCHOLESTEROLEMIA  . Routine general medical examination at a health care facility  . Hypokalemia  . Leukopenia  . Pancreatitis  . Other screening mammogram   Past Medical History  Diagnosis Date  . Anemia     iron deficiency  . Anxiety   . Asthma   . Gout   . Hypertension   . Hyperthyroidism    Past Surgical History  Procedure Date  . Cesarean section   . Abdominal hysterectomy 04/2006    partial for fibroids   History  Substance Use Topics  . Smoking status: Former Games developer  . Smokeless tobacco: Former Neurosurgeon    Quit date: 06/09/2010  . Alcohol Use: Not on file   Family History  Problem Relation Age of Onset  . Cancer Brother    lung CA  . Cancer Maternal Grandmother     breast CA   Allergies  Allergen Reactions  . Codeine     REACTION: headaches  . Varenicline Tartrate     REACTION: depressed  . Amoxicillin Rash   Current Outpatient Prescriptions on File Prior to Visit  Medication Sig Dispense Refill  . hydrochlorothiazide 25 MG tablet Take 25 mg by mouth daily.        Marland Kitchen omeprazole (PRILOSEC) 20 MG capsule Take 20 mg by mouth daily.        . fluticasone (FLONASE) 50 MCG/ACT nasal spray Place 2 sprays into the nose daily as needed.            Review of Systems Review of Systems  Constitutional: Negative for fever, appetite change, fatigue and unexpected weight change.  Eyes: Negative for pain and visual disturbance.  Respiratory: Negative for cough and shortness of breath.   Cardiovascular: Negative.for cp or sob or palpitations   Gastrointestinal: Negative for nausea, diarrhea and constipation.  Genitourinary: Negative for urgency and frequency.  Skin: Negative for pallor. or rash  Neurological: Negative for weakness, light-headedness, numbness and headaches.  Hematological: Negative for adenopathy. Does not bruise/bleed easily.  Psychiatric/Behavioral: Negative for dysphoric mood. The patient is not nervous/anxious.         Objective:   Physical Exam  Constitutional: She appears well-developed. No distress.       overwt and well appearing   HENT:  Head: Normocephalic and atraumatic.  Right Ear: External ear normal.  Left Ear: External ear normal.  Mouth/Throat: Oropharynx is clear and moist.  Eyes: Conjunctivae and EOM are normal. Pupils are equal, round, and reactive to light.  Neck: Normal range of motion. Neck supple. No JVD present. Carotid bruit is not present. No thyromegaly present.  Cardiovascular: Normal rate, normal heart sounds and intact distal pulses.   Pulmonary/Chest: Effort normal and breath sounds normal. No respiratory distress. She has no wheezes. She exhibits no  tenderness.  Abdominal: Soft. Bowel sounds are normal. She exhibits no distension, no abdominal bruit and no mass. There is no hepatosplenomegaly. There is no tenderness. There is negative Murphy's sign.  Musculoskeletal: Normal range of motion. She exhibits no edema and no tenderness.  Lymphadenopathy:    She has no cervical adenopathy.  Neurological: She is alert. She has normal reflexes. Coordination normal.  Skin: Skin is warm and dry. No rash noted. No erythema. No pallor.  Psychiatric: She has a normal mood and affect.          Assessment & Plan:

## 2010-11-12 NOTE — Assessment & Plan Note (Signed)
Reviewed health habits including diet and exercise and skin cancer prevention Also reviewed health mt list, fam hx and immunizations  Labs today  

## 2010-11-12 NOTE — Assessment & Plan Note (Signed)
Labs today Also pt had low wbc in hosp - poss due to virus Adv after labs

## 2010-11-12 NOTE — Assessment & Plan Note (Signed)
Labs today Disc low sat fat diet Some wt loss- commended Starting to go to gym now

## 2010-12-12 ENCOUNTER — Other Ambulatory Visit: Payer: BC Managed Care – PPO

## 2010-12-16 ENCOUNTER — Other Ambulatory Visit (INDEPENDENT_AMBULATORY_CARE_PROVIDER_SITE_OTHER): Payer: BC Managed Care – PPO | Admitting: Family Medicine

## 2010-12-16 ENCOUNTER — Telehealth: Payer: Self-pay | Admitting: Family Medicine

## 2010-12-16 ENCOUNTER — Other Ambulatory Visit: Payer: Self-pay | Admitting: Family Medicine

## 2010-12-16 DIAGNOSIS — Z202 Contact with and (suspected) exposure to infections with a predominantly sexual mode of transmission: Secondary | ICD-10-CM

## 2010-12-16 DIAGNOSIS — Z0000 Encounter for general adult medical examination without abnormal findings: Secondary | ICD-10-CM

## 2010-12-16 DIAGNOSIS — E039 Hypothyroidism, unspecified: Secondary | ICD-10-CM

## 2010-12-16 DIAGNOSIS — E876 Hypokalemia: Secondary | ICD-10-CM

## 2010-12-16 DIAGNOSIS — E785 Hyperlipidemia, unspecified: Secondary | ICD-10-CM

## 2010-12-16 DIAGNOSIS — K859 Acute pancreatitis without necrosis or infection, unspecified: Secondary | ICD-10-CM

## 2010-12-16 DIAGNOSIS — E7800 Pure hypercholesterolemia, unspecified: Secondary | ICD-10-CM

## 2010-12-16 DIAGNOSIS — D72819 Decreased white blood cell count, unspecified: Secondary | ICD-10-CM

## 2010-12-16 LAB — CBC WITH DIFFERENTIAL/PLATELET
Basophils Absolute: 0 10*3/uL (ref 0.0–0.1)
Eosinophils Absolute: 0.1 10*3/uL (ref 0.0–0.7)
Lymphocytes Relative: 38.6 % (ref 12.0–46.0)
MCHC: 34.3 g/dL (ref 30.0–36.0)
MCV: 90.3 fl (ref 78.0–100.0)
Monocytes Absolute: 0.5 10*3/uL (ref 0.1–1.0)
Neutrophils Relative %: 47.3 % (ref 43.0–77.0)
Platelets: 251 10*3/uL (ref 150.0–400.0)

## 2010-12-16 LAB — COMPREHENSIVE METABOLIC PANEL
ALT: 14 U/L (ref 0–35)
AST: 18 U/L (ref 0–37)
Albumin: 4.5 g/dL (ref 3.5–5.2)
Alkaline Phosphatase: 45 U/L (ref 39–117)
Chloride: 102 mEq/L (ref 96–112)
Potassium: 3.4 mEq/L — ABNORMAL LOW (ref 3.5–5.1)
Sodium: 139 mEq/L (ref 135–145)
Total Protein: 8.1 g/dL (ref 6.0–8.3)

## 2010-12-16 LAB — TSH: TSH: 2.42 u[IU]/mL (ref 0.35–5.50)

## 2010-12-16 LAB — LIPID PANEL
Total CHOL/HDL Ratio: 5
Triglycerides: 90 mg/dL (ref 0.0–149.0)

## 2010-12-16 LAB — LIPASE: Lipase: 56 U/L (ref 11.0–59.0)

## 2010-12-16 NOTE — Telephone Encounter (Signed)
I ordered all but the hepatitis I could not find the hepatitis acute panel we used to use ...any ideas?

## 2010-12-16 NOTE — Telephone Encounter (Signed)
Message copied by Judy Pimple on Mon Dec 16, 2010  2:06 PM ------      Message from: Alvina Chou      Created: Mon Dec 16, 2010 10:10 AM       Patient wants all std's done today with her lab work. Exposure. HIV,RPR,HERPIES, HEP

## 2010-12-16 NOTE — Progress Notes (Signed)
Addended by: Alvina Chou on: 12/16/2010 02:27 PM   Modules accepted: Orders

## 2010-12-17 LAB — HEPATITIS PANEL, ACUTE: Hepatitis B Surface Ag: NEGATIVE

## 2010-12-17 LAB — RPR

## 2010-12-17 LAB — HSV 2 ANTIBODY, IGG: HSV 2 Glycoprotein G Ab, IgG: 12.44 IV — ABNORMAL HIGH

## 2010-12-17 LAB — HIV ANTIBODY (ROUTINE TESTING W REFLEX): HIV: REACTIVE

## 2010-12-23 ENCOUNTER — Other Ambulatory Visit: Payer: Self-pay | Admitting: Family Medicine

## 2010-12-24 ENCOUNTER — Telehealth: Payer: Self-pay | Admitting: Family Medicine

## 2010-12-27 LAB — HIV 1/2 CONFIRMATION
HIV-1 antibody: POSITIVE — AB
HIV-2 Ab: NEGATIVE

## 2011-01-03 ENCOUNTER — Ambulatory Visit (INDEPENDENT_AMBULATORY_CARE_PROVIDER_SITE_OTHER): Payer: BC Managed Care – PPO | Admitting: Family Medicine

## 2011-01-03 ENCOUNTER — Encounter: Payer: Self-pay | Admitting: Family Medicine

## 2011-01-03 DIAGNOSIS — E876 Hypokalemia: Secondary | ICD-10-CM

## 2011-01-03 DIAGNOSIS — I10 Essential (primary) hypertension: Secondary | ICD-10-CM

## 2011-01-03 DIAGNOSIS — D72819 Decreased white blood cell count, unspecified: Secondary | ICD-10-CM

## 2011-01-03 DIAGNOSIS — Z21 Asymptomatic human immunodeficiency virus [HIV] infection status: Secondary | ICD-10-CM

## 2011-01-03 NOTE — Progress Notes (Signed)
Subjective:    Patient ID: Carla Thomas, female    DOB: July 03, 1965, 45 y.o.   MRN: [id number]  HPI Here for f/u of labs  Was exposed to hiv in 99  Had a needle stick Had testing done at the time and 6 mo later however that was neg Her husb recently dx with hiv Her test today alone with western blot are positive  hiv 2 ab is neg She is not extremely surprised about this  Is emotionally doing ok  States her husband was cheating for years as well and she is leaving him     RPR neg  HSV 1 and 2 pos ab  Hepatitis panels neg   Is feeling fine  No unexplained infections or skin changes No hx of genital herpes   Still struggles with smoking    k 3.4  She wants to correct that with diet  Does not take a mvi but is open to it   Patient Active Problem List  Diagnoses  . FIBROIDS, UTERUS  . GOITER  . GRAVE'S DISEASE  . GOUT  . ANEMIA-IRON DEFICIENCY  . ANXIETY  . HYPERTENSION  . RHINITIS, ALLERGIC NEC  . ASTHMA  . DYSPEPSIA  . ECZEMA  . PLANTAR FASCIITIS  . PALPITATIONS  . HYPERCHOLESTEROLEMIA  . Routine general medical examination at a health care facility  . Hypokalemia  . Leukopenia  . Pancreatitis  . Other screening mammogram  . Exposure to STD   Past Medical History  Diagnosis Date  . Anemia     iron deficiency  . Anxiety   . Asthma   . Gout   . Hypertension   . Hyperthyroidism    Past Surgical History  Procedure Date  . Cesarean section   . Abdominal hysterectomy 04/2006    partial for fibroids   History  Substance Use Topics  . Smoking status: Former Games developer  . Smokeless tobacco: Former Neurosurgeon    Quit date: 07/03/2010  . Alcohol Use: Not on file   Family History  Problem Relation Age of Onset  . Cancer Brother     lung CA  . Cancer Maternal Grandmother     breast CA   Allergies  Allergen Reactions  . Codeine     REACTION: headaches  . Varenicline Tartrate     REACTION: depressed  . Amoxicillin Rash   Current Outpatient  Prescriptions on File Prior to Visit  Medication Sig Dispense Refill  . hydrochlorothiazide 25 MG tablet TAKE ONE TABLET BY MOUTH IN THE MORNING  30 tablet  11  . omeprazole (PRILOSEC) 20 MG capsule Take 20 mg by mouth daily.        . fluticasone (FLONASE) 50 MCG/ACT nasal spray Place 2 sprays into the nose daily as needed.            Review of Systems Review of Systems  Constitutional: Negative for fever, appetite change, fatigue and unexpected weight change.  Eyes: Negative for pain and visual disturbance.  Respiratory: Negative for cough and shortness of breath.   Cardiovascular: Negative.  for cp or sob or palpitations Gastrointestinal: Negative for nausea, diarrhea and constipation. (was hosp with abd pain recently and that did not come back) Genitourinary: Negative for urgency and frequency.  Skin: Negative for pallor. or rash  Neurological: Negative for weakness, light-headedness, numbness and headaches.  Hematological: Negative for adenopathy. Does not bruise/bleed easily.  Psychiatric/Behavioral: Negative for dysphoric mood. The patient is not nervous/anxious.  Objective:   Physical Exam  Constitutional: She appears well-developed and well-nourished. No distress.       overwt and well appearing   HENT:  Head: Normocephalic and atraumatic.  Right Ear: External ear normal.  Left Ear: External ear normal.  Nose: Nose normal.  Mouth/Throat: Oropharynx is clear and moist.  Eyes: Conjunctivae and EOM are normal. Pupils are equal, round, and reactive to light.  Neck: Normal range of motion. Neck supple. No JVD present. No thyromegaly present.  Cardiovascular: Normal rate, regular rhythm, normal heart sounds and intact distal pulses.   Pulmonary/Chest: Effort normal and breath sounds normal. No respiratory distress. She has no wheezes.  Abdominal: Soft. Bowel sounds are normal. She exhibits no distension and no mass. There is no tenderness.  Musculoskeletal: She  exhibits no edema.       No acute joint changes   Lymphadenopathy:    She has no cervical adenopathy.  Neurological: She is alert. She has normal reflexes. She exhibits normal muscle tone. Coordination normal.  Skin: Skin is warm and dry. No rash noted. No erythema. No pallor.  Psychiatric: She has a normal mood and affect.       Calm  Not anxious at all           Assessment & Plan:   No problem-specific assessment & plan notes found for this encounter.

## 2011-01-03 NOTE — Patient Instructions (Addendum)
Try to get more potassium -- bananas/ cantelope/ orange juice  Your potassium is very slightly low  Follow up here in 6 months Keep thinking about the smoking  I will be calling you later to arrange the infectious disease consult

## 2011-01-05 DIAGNOSIS — B20 Human immunodeficiency virus [HIV] disease: Secondary | ICD-10-CM | POA: Insufficient documentation

## 2011-01-05 NOTE — Assessment & Plan Note (Addendum)
bp still well controlled  Rev labs with pt  Again urged strongly to quit smoking

## 2011-01-05 NOTE — Assessment & Plan Note (Signed)
This was in hospital in conj with pancreatitis - now resolved  New dx of HIV as well

## 2011-01-05 NOTE — Assessment & Plan Note (Signed)
Pt was exp with needle stick from known hiv pos pt in past (though tests then were neg) She is totally asymptomatic with this  Also husband is pos and pt suspects infidelity Will ref to ID for further eval  Counseled on avoiding infections/ etc

## 2011-01-05 NOTE — Assessment & Plan Note (Signed)
Very mild at 3.4 Pt aware without cramps or other symptoms She wants to correct this with high K diet and MVI daily Will continue to monitor Did set up next f/u for 6 mo

## 2011-01-08 ENCOUNTER — Telehealth: Payer: Self-pay | Admitting: *Deleted

## 2011-01-08 NOTE — Telephone Encounter (Signed)
Please ask them if pt needs to know I will talk to them and sign a release of information  Pt wants to keep as confidential as possible I am not aware of the general rules and procedure for this

## 2011-01-08 NOTE — Telephone Encounter (Signed)
Elfredia Nevins notified as instructed by telephone.Denyse Amass said no release from pt is necessary that their agency is immune from [employer]. Denyse Amass will fax information to Dr Milinda Antis and then will set up appt to come in and talk with Dr Milinda Antis. I explained Dr Milinda Antis was not in the office on Thursdays. When receive fax with send this note to Dr Milinda Antis and put fax on her shelf.

## 2011-01-08 NOTE — Telephone Encounter (Signed)
Caller from the state health dept wants to come by this morning to get more information regarding pt's recent HIV diagnosis.  He says the health dept was notified of the positive test result but they don't supply all the information that the state wants.  They want to know things such as why pt was tested, any contacts and plan of treatment.  Please advise if ok for him to come by.

## 2011-01-08 NOTE — Telephone Encounter (Signed)
Received fax from Elfredia Nevins. Fax is on your shelf in the in box.

## 2011-01-09 NOTE — Telephone Encounter (Signed)
All info requested was faxed to Elfredia Nevins [phone] as instructed. I called Mr Chipper Herb office to confirm he received the fax and he was out of office spoke with [facility] and she was sure he had gotten the fax and I left v/m if he needed any other info to please contact our office.

## 2011-01-09 NOTE — Telephone Encounter (Signed)
I saw the fax- it is in the IN box and I indicated to send the records requested-that should help them out

## 2011-01-15 ENCOUNTER — Telehealth: Payer: Self-pay | Admitting: *Deleted

## 2011-01-15 NOTE — Telephone Encounter (Signed)
Patient is asking if you could give her a call. I asked her if it was something I could help her with, but she said that she could only speak with you and it was nothing I could do.

## 2011-01-20 ENCOUNTER — Other Ambulatory Visit: Payer: Self-pay | Admitting: Family Medicine

## 2011-01-21 NOTE — Telephone Encounter (Signed)
[facility] [address] electronically request Omeprazole 20 mg #30 x 11.

## 2011-01-22 ENCOUNTER — Telehealth: Payer: Self-pay | Admitting: Family Medicine

## 2011-01-22 MED ORDER — OMEPRAZOLE 20 MG PO CPDR: 20.0000 mg | DELAYED_RELEASE_CAPSULE | Freq: Every day | ORAL | Status: DC

## 2011-01-22 MED ORDER — HYDROCHLOROTHIAZIDE 25 MG PO TABS: 25.0000 mg | ORAL_TABLET | Freq: Every day | ORAL | Status: DC

## 2011-01-22 NOTE — Telephone Encounter (Signed)
Px were done for [facility] as faxes

## 2011-01-22 NOTE — Telephone Encounter (Signed)
I spoke to her She was waiting to hear about ID consult Turns out they did not get the fax - so re faxed all info and they will call her

## 2011-01-27 ENCOUNTER — Telehealth: Payer: Self-pay | Admitting: *Deleted

## 2011-01-27 NOTE — Telephone Encounter (Signed)
Pt says she was awaiting your return call, you can reach her today after 5pm or anytime tomorrow.

## 2011-01-27 NOTE — Telephone Encounter (Signed)
Found out today pt was contacted by the ID office and scheduled for appt aug 28th at 11 am with Tomasita Morrow  She is aware

## 2011-02-04 ENCOUNTER — Ambulatory Visit (INDEPENDENT_AMBULATORY_CARE_PROVIDER_SITE_OTHER): Payer: BC Managed Care – PPO

## 2011-02-04 DIAGNOSIS — B20 Human immunodeficiency virus [HIV] disease: Secondary | ICD-10-CM

## 2011-02-04 DIAGNOSIS — Z23 Encounter for immunization: Secondary | ICD-10-CM

## 2011-02-04 DIAGNOSIS — Z299 Encounter for prophylactic measures, unspecified: Secondary | ICD-10-CM

## 2011-02-04 LAB — CBC WITH DIFFERENTIAL/PLATELET
Basophils Absolute: 0 10*3/uL (ref 0.0–0.1)
Basophils Relative: 0 % (ref 0–1)
Eosinophils Absolute: 0.2 10*3/uL (ref 0.0–0.7)
Eosinophils Relative: 3 % (ref 0–5)
MCH: 29.6 pg (ref 26.0–34.0)
MCHC: 32.9 g/dL (ref 30.0–36.0)
MCV: 90 fL (ref 78.0–100.0)
Neutrophils Relative %: 67 % (ref 43–77)
Platelets: 287 10*3/uL (ref 150–400)
RDW: 13.8 % (ref 11.5–15.5)

## 2011-02-04 LAB — URINALYSIS, MICROSCOPIC ONLY
Bacteria, UA: NONE SEEN
Squamous Epithelial / LPF: NONE SEEN

## 2011-02-04 LAB — URINALYSIS, ROUTINE W REFLEX MICROSCOPIC
Bilirubin Urine: NEGATIVE
Nitrite: NEGATIVE
Protein, ur: NEGATIVE mg/dL
Urobilinogen, UA: 1 mg/dL (ref 0.0–1.0)

## 2011-02-04 LAB — COMPLETE METABOLIC PANEL WITH GFR
ALT: 11 U/L (ref 0–35)
AST: 17 U/L (ref 0–37)
Albumin: 4.5 g/dL (ref 3.5–5.2)
BUN: 18 mg/dL (ref 6–23)
CO2: 24 mEq/L (ref 19–32)
Calcium: 9.7 mg/dL (ref 8.4–10.5)
Chloride: 102 mEq/L (ref 96–112)
GFR, Est African American: >60 mL/min (ref >60)
Potassium: 4 mEq/L (ref 3.5–5.3)

## 2011-02-04 LAB — LIPID PANEL
HDL: 46 mg/dL (ref >39)
LDL Cholesterol: 108 mg/dL — ABNORMAL HIGH (ref 0–99)

## 2011-02-05 LAB — T-HELPER CELL (CD4) - (RCID CLINIC ONLY): CD4 T Cell Abs: 420 uL (ref 400–2700)

## 2011-02-05 MED ORDER — PNEUMOCOCCAL VAC POLYVALENT 25 MCG/0.5ML IJ INJ: 0.5000 mL | INJECTION | Freq: Once | INTRAMUSCULAR | Status: DC

## 2011-02-06 LAB — TB SKIN TEST
Induration: 0
TB Skin Test: NEGATIVE mm

## 2011-02-06 LAB — HIV-1 RNA ULTRAQUANT REFLEX TO GENTYP+
HIV 1 RNA Quant: 5050 copies/mL — ABNORMAL HIGH (ref <20)
HIV-1 RNA Quant, Log: 3.7 {Log} — ABNORMAL HIGH (ref <1.30)

## 2011-02-12 NOTE — Progress Notes (Signed)
Pt states she was tested for HIV  after her husband informed her of his positive test a few months ago. They have been married for 18 years.  They are still together and continue as husband and wife for the kids sake. They sleep in separate rooms and have a mutual understanding about their marriage and living arrangements.  They have not been sexually active in years per pt. She is aware spouse has other sexual female partners for many years. She does not think he would have sex with men. She works in a dialysis center and has experienced two needle sticks in 1999.  Both patients were HIV negative but she was given AZT for a few days per pt. He test were also negative for hepatitis and HIV.   She has not been tested for HIV since needle stick. There is a concerning rash present right lower leg. Raised dark, dry, ashen with itching  present for at least 2 months per pt.  She thinks it is related to her thyroid disorder. I have requested she have this assessed by Dr Luciana Axe upon return visit.

## 2011-02-20 ENCOUNTER — Ambulatory Visit (INDEPENDENT_AMBULATORY_CARE_PROVIDER_SITE_OTHER): Payer: BC Managed Care – PPO | Admitting: Internal Medicine

## 2011-02-20 ENCOUNTER — Encounter: Payer: Self-pay | Admitting: Internal Medicine

## 2011-02-20 VITALS — BP 136/90 | HR 75 | Temp 97.9°F | Ht 66.5 in | Wt 200.0 lb

## 2011-02-20 DIAGNOSIS — Z21 Asymptomatic human immunodeficiency virus [HIV] infection status: Secondary | ICD-10-CM

## 2011-02-20 DIAGNOSIS — B20 Human immunodeficiency virus [HIV] disease: Secondary | ICD-10-CM

## 2011-02-20 DIAGNOSIS — Z23 Encounter for immunization: Secondary | ICD-10-CM

## 2011-02-20 MED ORDER — EMTRICITAB-RILPIVIR-TENOFOV DF 200-25-300 MG PO TABS: 1.0000 | ORAL_TABLET | Freq: Every day | ORAL | Status: DC

## 2011-02-20 NOTE — Progress Notes (Signed)
  Subjective:    Patient ID: Carla Thomas, female    DOB: 04/13/1966, 45 y.o.   MRN: [id number]  HPI she comes in here as a new patient for over 14. She was recently diagnosed by her primary physician. She was recently informed by her husband that should he is positive for HIV and this prompted her testing. She is not currently sexually active with her husband however suspect she did get HIV from her husband previously. She does work in dialysis and has a remote history of a needle stick from an HIV-positive patient and took AZT but unclear what her current infection is from. She otherwise has no complaints including no weight loss, no dysphasia, no shortness of breath. She denies any history of gonorrhea or chlamydia. She does complain of a rash on her right leg that is mildly pruritic. She has many questions on HIV and treatment. She tells me she is able to take her medicines daily and is eager for therapy.    Review of Systems  Skin:       + dark, non papular rash on right front lower leg  All other systems reviewed and are negative.       Objective:   Physical Exam  Constitutional: She is oriented to person, place, and time. She appears well-developed and well-nourished.  HENT:  Mouth/Throat: No oropharyngeal exudate.  Eyes: No scleral icterus.  Cardiovascular: Normal rate, regular rhythm and normal heart sounds.   No murmur heard. Pulmonary/Chest: Effort normal and breath sounds normal. She has no wheezes.  Abdominal: Soft. Bowel sounds are normal. There is no tenderness.  Lymphadenopathy:    She has no cervical adenopathy.  Neurological: She is alert and oriented to person, place, and time.  Skin: Skin is warm and dry. No erythema.       + dark lesion anterior front leg  Psychiatric: She has a normal mood and affect. Her behavior is normal.          Assessment & Plan:

## 2011-02-20 NOTE — Assessment & Plan Note (Addendum)
I discussed the different treatment options with her and will start her on complera. I discussed with her the need to take it separated by 12 hours with her omeprazole. She also will take it with food. I also discussed side effects including bone density and she will take calcium with vitamin D as well. I discussed if she does reinstitute sexual activity to assure that the condom use is done. I discussed long-term effects of HIV including cardiac and renal dysfunction in the need for good healthy living. She is eager to start medications and I have prescribed her her medications today. She will return in approximately 6 weeks to check her labs to follow her viral load. She was told to call she has any component complaints with the medications or issues. All questions were answered.  She will get her Pap smear done by her primary physician in next visit she will need her hepatitis and tetanus vaccinations.

## 2011-02-21 ENCOUNTER — Ambulatory Visit: Payer: BC Managed Care – PPO | Admitting: Family Medicine

## 2011-02-24 LAB — HIV-1 GENOTYPR PLUS

## 2011-02-26 ENCOUNTER — Ambulatory Visit (INDEPENDENT_AMBULATORY_CARE_PROVIDER_SITE_OTHER): Payer: BC Managed Care – PPO | Admitting: Family Medicine

## 2011-02-26 ENCOUNTER — Encounter: Payer: Self-pay | Admitting: Family Medicine

## 2011-02-26 DIAGNOSIS — J3089 Other allergic rhinitis: Secondary | ICD-10-CM

## 2011-02-26 DIAGNOSIS — R220 Localized swelling, mass and lump, head: Secondary | ICD-10-CM

## 2011-02-26 DIAGNOSIS — R221 Localized swelling, mass and lump, neck: Secondary | ICD-10-CM

## 2011-02-26 DIAGNOSIS — J329 Chronic sinusitis, unspecified: Secondary | ICD-10-CM

## 2011-02-26 MED ORDER — ACETAMINOPHEN 325 MG PO TABS: 650.0000 mg | ORAL_TABLET | ORAL | Status: AC | PRN

## 2011-02-26 MED ORDER — SULFAMETHOXAZOLE-TRIMETHOPRIM 800-160 MG PO TABS: 1.0000 | ORAL_TABLET | Freq: Two times a day (BID) | ORAL | Status: AC

## 2011-02-26 MED ORDER — FLUTICASONE PROPIONATE 50 MCG/ACT NA SUSP: 2.0000 | Freq: Every day | NASAL | Status: DC | PRN

## 2011-02-26 MED ORDER — ALBUTEROL SULFATE HFA 108 (90 BASE) MCG/ACT IN AERS: 2.0000 | INHALATION_SPRAY | RESPIRATORY_TRACT | Status: DC | PRN

## 2011-02-26 NOTE — Assessment & Plan Note (Signed)
Refilled flonase ns  This controls congestion well for her Disc avoiding allergens as well- is seasonal

## 2011-02-26 NOTE — Patient Instructions (Signed)
Drink lots of fluids Take the bactrim for sinus infection as directed Use flonase  You can also use nasal saline spray as needed  If worse or fever - let me know  Lump in your neck does not feel like anything worrisome - so let me know if anything changes  I sent px to your pharmacy  Please schedule appt for physical with pap - any 30 min slot is fine

## 2011-02-26 NOTE — Assessment & Plan Note (Signed)
Area of concern palpated and feels more like a tendon than a lump  Will continue to watch closely however

## 2011-02-26 NOTE — Progress Notes (Signed)
Subjective:    Patient ID: Carla Thomas, female    DOB: 1965/07/06, 45 y.o.   MRN: [id number]  HPI Here for lump on neck, sinus symptoms and Flonase refill   Sinus problems  Thinks she has a sinus infection  Some drainage - blowing out clear mucous  Smells funny smell - this happens when she gets a sinus infection  A lot of facial pressure and pain and headache - no meds   Also a lump in her neck -- is a little knot in R side -- ? 3 weeks  Does not hurt - very small - not changed , and can move it around  No other lumps   flonase is outdated  Used her daughter's nasonex (does not work as well) Sneezes and has congestion this time of year  Uses flonase in spring and fall  Patient Active Problem List  Diagnoses  . FIBROIDS, UTERUS  . GOITER  . GRAVE'S DISEASE  . GOUT  . ANEMIA-IRON DEFICIENCY  . ANXIETY  . HYPERTENSION  . RHINITIS, ALLERGIC NEC  . ASTHMA  . DYSPEPSIA  . ECZEMA  . PLANTAR FASCIITIS  . PALPITATIONS  . HYPERCHOLESTEROLEMIA  . Routine general medical examination at a health care facility  . Hypokalemia  . Leukopenia  . Pancreatitis  . Other screening mammogram  . Exposure to STD  . HIV disease  . Sinus infection  . Lump in neck   Past Medical History  Diagnosis Date  . Anemia     iron deficiency  . Anxiety   . Asthma   . Gout   . Hypertension   . Hyperthyroidism    Past Surgical History  Procedure Date  . Cesarean section   . Abdominal hysterectomy 04/2006    partial for fibroids   History  Substance Use Topics  . Smoking status: Current Some Day Smoker -- 0.3 packs/day    Types: Cigarettes  . Smokeless tobacco: Never Used   Comment: pt. recently started smoking again . 2 cigs per day , tying to quit for 3 weeks   . Alcohol Use: No   Family History  Problem Relation Age of Onset  . Cancer Brother     lung CA  . Cancer Maternal Grandmother     breast CA   Allergies  Allergen Reactions  . Codeine     REACTION: headaches    . Varenicline Tartrate     REACTION: depressed  . Amoxicillin Rash   Current Outpatient Prescriptions on File Prior to Visit  Medication Sig Dispense Refill  . Emtricitab-Rilpivir-Tenofovir (COMPLERA) 200-25-300 MG TABS Take 1 tablet by mouth daily.  30 tablet  5  . hydrochlorothiazide 25 MG tablet Take 1 tablet (25 mg total) by mouth daily.  90 tablet  3  . omeprazole (PRILOSEC) 20 MG capsule Take 1 capsule (20 mg total) by mouth daily.  90 capsule  3   Current Facility-Administered Medications on File Prior to Visit  Medication Dose Route Frequency Provider Last Rate Last Dose  . pneumococcal 23 valent vaccine (PNU-IMMUNE) injection 0.5 mL  0.5 mL Intramuscular Once Acey Lav, MD         Review of Systems Review of Systems  Constitutional: Negative for fever, appetite change, fatigue and unexpected weight change.  Eyes: Negative for pain and visual disturbance ENT pos for sinus pain and congestion, no sore throat .  Respiratory: Negative for cough and shortness of breath.   Cardiovascular: Negative for cp or palpitations  Gastrointestinal: Negative for nausea, diarrhea and constipation.  Genitourinary: Negative for urgency and frequency.  Skin: Negative for pallor or rash   Neurological: Negative for weakness, light-headedness, numbness and headaches.  Hematological: Negative for adenopathy. Does not bruise/bleed easily.  Psychiatric/Behavioral: Negative for dysphoric mood. The patient is not nervous/anxious.          Objective:   Physical Exam  Constitutional: She appears well-developed and well-nourished. No distress.       overwt and well appearing   HENT:  Head: Normocephalic and atraumatic.  Right Ear: External ear normal.  Left Ear: External ear normal.       Nares are boggy and injected and congested  Tender frontal and maxillary sinuses    Eyes: Conjunctivae and EOM are normal. Pupils are equal, round, and reactive to light. Right eye exhibits no  discharge. Left eye exhibits no discharge.  Neck: Normal range of motion. Neck supple. No JVD present. Carotid bruit is not present. Erythema present. No tracheal deviation present. No thyromegaly present.       Lump in L lower neck feels liner to palpation - like calcified tendon No tenderness  No other lumps  No adenopathy -cervical or axillary or in groin   Cardiovascular: Normal rate, regular rhythm and normal heart sounds.   Pulmonary/Chest: Effort normal and breath sounds normal. No respiratory distress. She has no wheezes.  Abdominal: Soft. Bowel sounds are normal. She exhibits no distension and no mass. There is no tenderness.  Musculoskeletal: She exhibits no edema and no tenderness.  Lymphadenopathy:    She has no cervical adenopathy.  Neurological: She is alert. She has normal reflexes. No cranial nerve deficit.  Skin: Skin is warm and dry. No rash noted. No erythema. No pallor.  Psychiatric: She has a normal mood and affect.          Assessment & Plan:

## 2011-02-26 NOTE — Assessment & Plan Note (Signed)
With sinus pressure/ congestion after a flare of allergic rhinitis Will tx with bactrim (pt is pcn allergic possibly) Update if not imp or if worse or fever Disc use of her flonase and also nasal saline for symptoms

## 2011-03-25 ENCOUNTER — Telehealth: Payer: Self-pay | Admitting: *Deleted

## 2011-03-25 NOTE — Telephone Encounter (Signed)
Carla Thomas from the [facility] health dept wanted to know when her next appt was & recent CD4 & Viral load was. Info given

## 2011-04-10 ENCOUNTER — Other Ambulatory Visit (INDEPENDENT_AMBULATORY_CARE_PROVIDER_SITE_OTHER): Payer: BC Managed Care – PPO

## 2011-04-10 ENCOUNTER — Other Ambulatory Visit: Payer: Self-pay | Admitting: Infectious Diseases

## 2011-04-10 DIAGNOSIS — B20 Human immunodeficiency virus [HIV] disease: Secondary | ICD-10-CM

## 2011-04-10 LAB — CBC WITH DIFFERENTIAL/PLATELET
Basophils Relative: 0 % (ref 0–1)
Eosinophils Absolute: 0.1 10*3/uL (ref 0.0–0.7)
Eosinophils Relative: 3 % (ref 0–5)
HCT: 41.1 % (ref 36.0–46.0)
Hemoglobin: 13.9 g/dL (ref 12.0–15.0)
Lymphs Abs: 1.2 10*3/uL (ref 0.7–4.0)
MCH: 30.3 pg (ref 26.0–34.0)
MCHC: 33.8 g/dL (ref 30.0–36.0)
MCV: 89.7 fL (ref 78.0–100.0)
Monocytes Absolute: 0.3 10*3/uL (ref 0.1–1.0)
Monocytes Relative: 7 % (ref 3–12)
Neutrophils Relative %: 62 % (ref 43–77)
RBC: 4.58 MIL/uL (ref 3.87–5.11)

## 2011-04-10 LAB — COMPLETE METABOLIC PANEL WITH GFR
ALT: 13 U/L (ref 0–35)
AST: 15 U/L (ref 0–37)
CO2: 28 mEq/L (ref 19–32)
Chloride: 100 mEq/L (ref 96–112)
GFR, Est African American: >90 mL/min (ref >90)
Sodium: 138 mEq/L (ref 135–145)
Total Bilirubin: 0.5 mg/dL (ref 0.3–1.2)
Total Protein: 7.3 g/dL (ref 6.0–8.3)

## 2011-04-14 ENCOUNTER — Ambulatory Visit: Payer: BC Managed Care – PPO

## 2011-04-14 LAB — HIV-1 RNA QUANT-NO REFLEX-BLD
HIV 1 RNA Quant: 59 copies/mL — ABNORMAL HIGH (ref <20)
HIV-1 RNA Quant, Log: 1.77 {Log} — ABNORMAL HIGH (ref <1.30)

## 2011-04-15 IMAGING — CR DG CHEST 2V
2 series · 2 of 2 positions shown · non-contrast
Comparison: None available.

CLINICAL DATA: 43-year-old female with chest pain and shortness of
breath.

CHEST - 2 VIEW

[w chest pa]
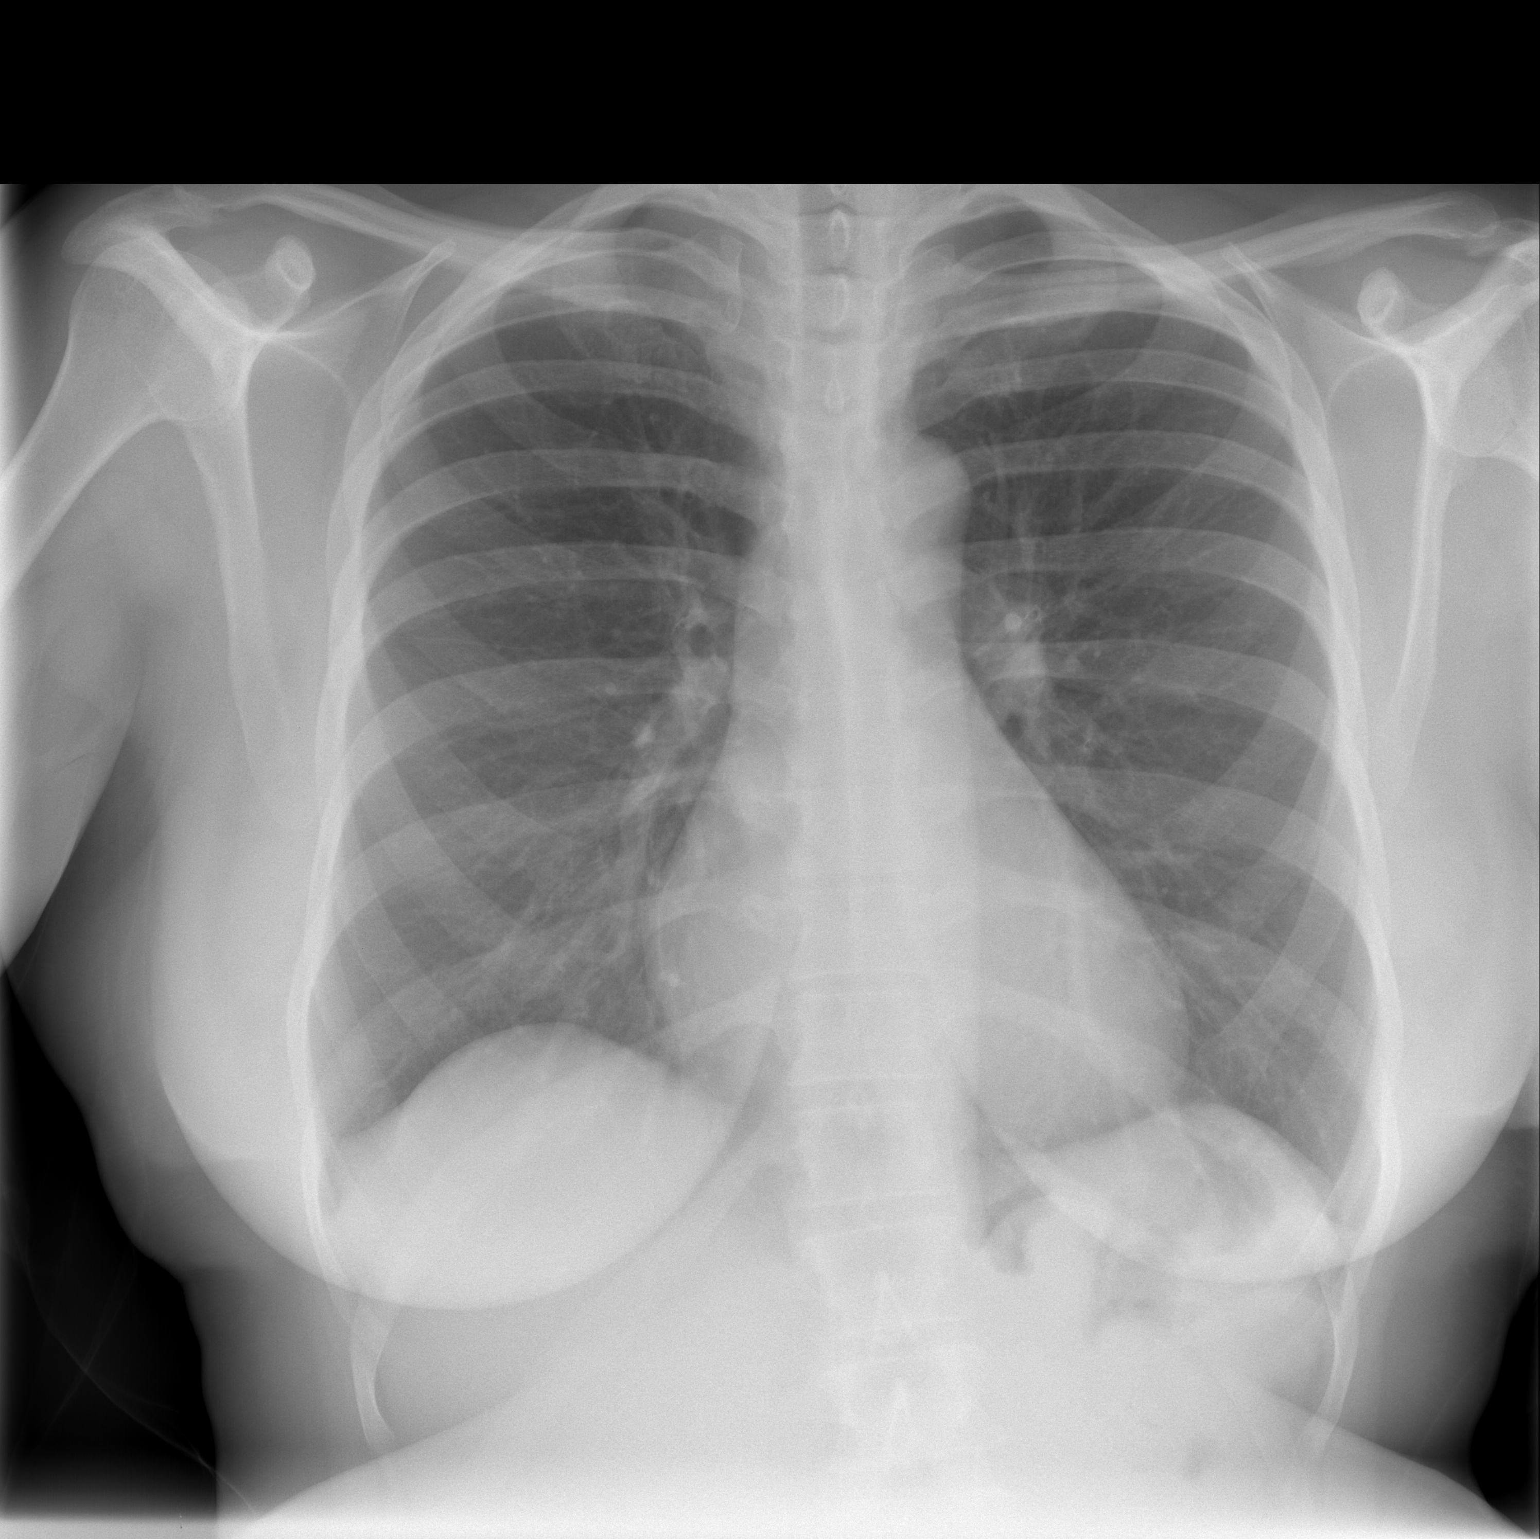

[w chest lat]
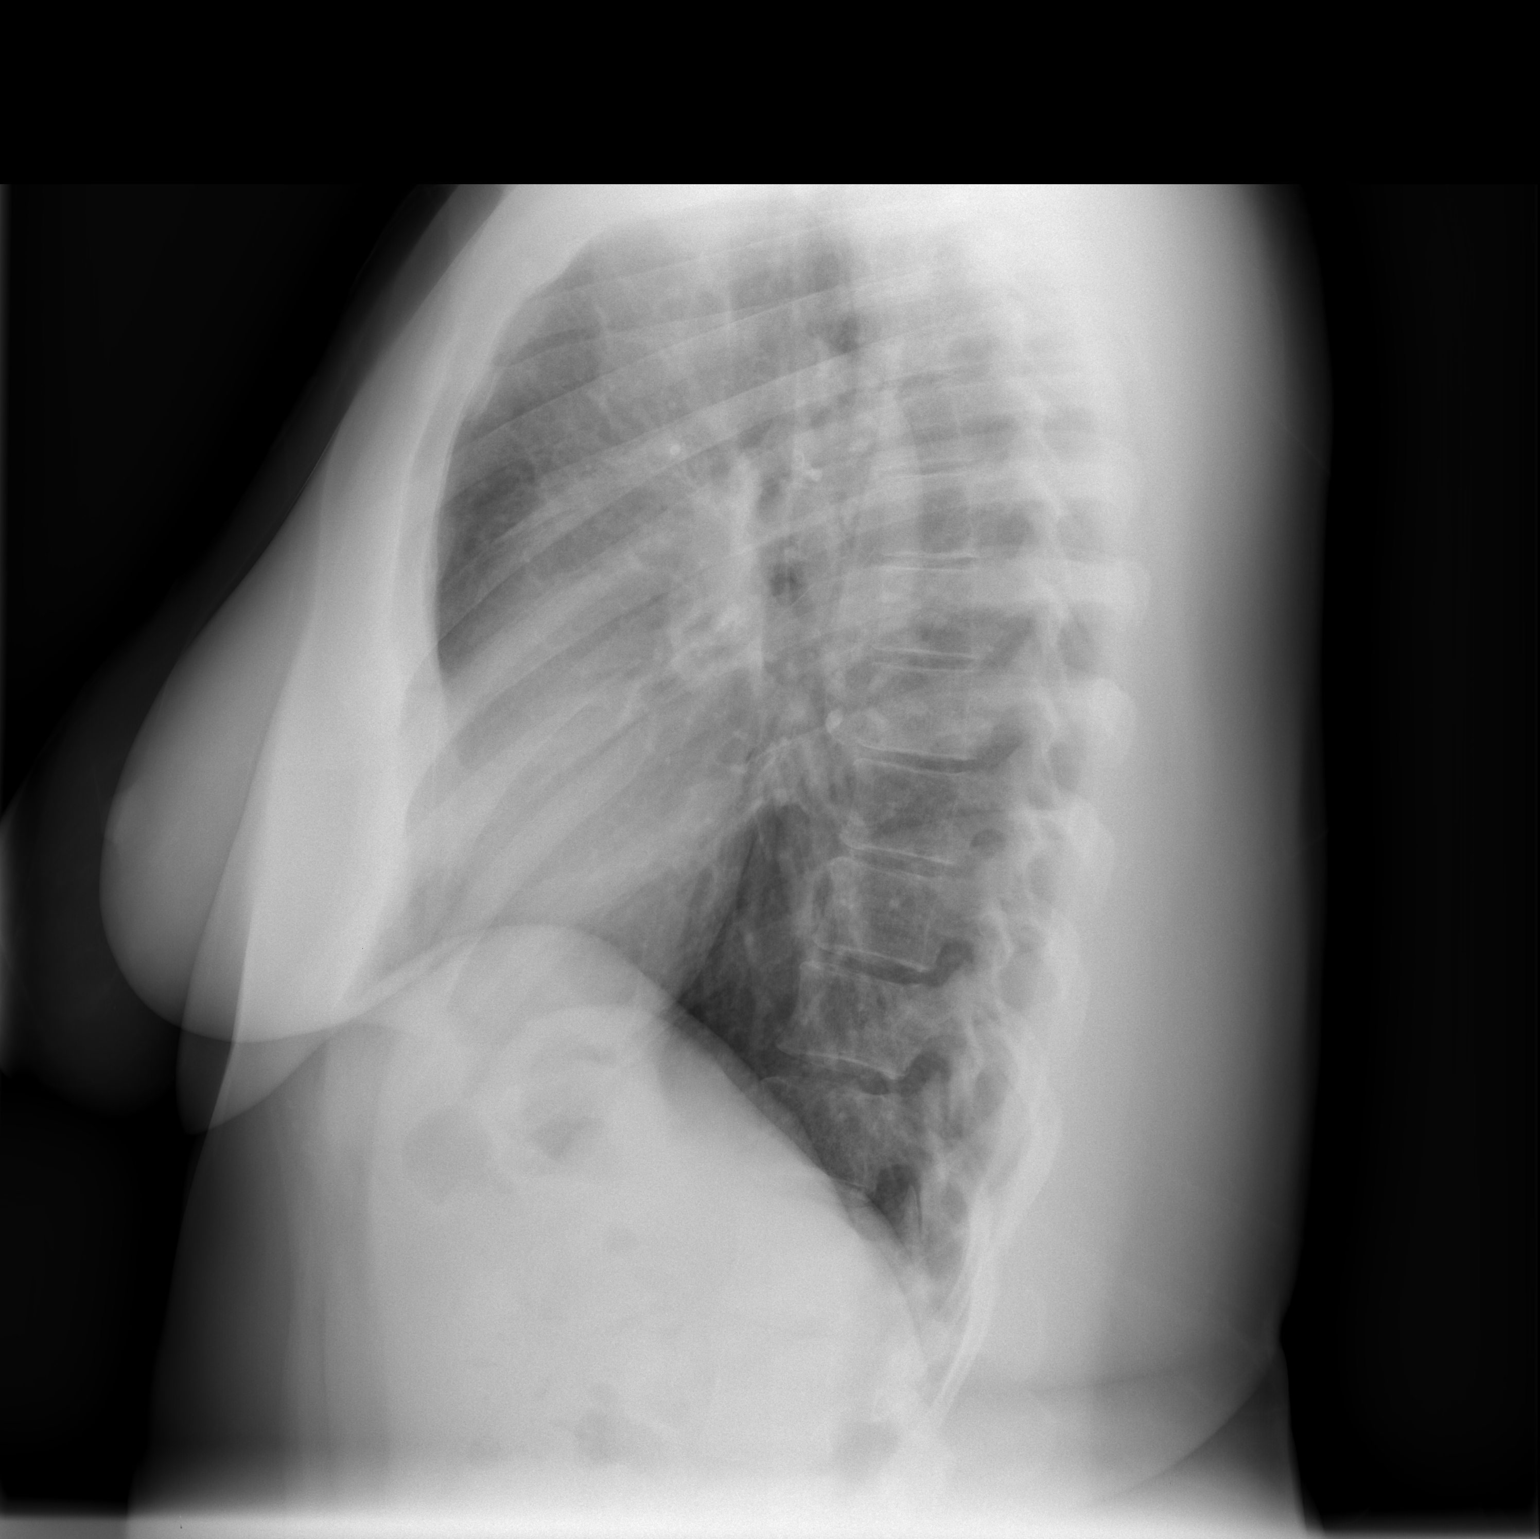

[2 of 2 positions shown; findings below may reference images not displayed]

FINDINGS: Normal lung volumes.  Cardiac size and mediastinal
contours are within normal limits.  Visualized tracheal air column
is within normal limits.  The lungs are clear.  No pneumothorax or
pleural effusion. No acute osseous abnormality identified.
IMPRESSION: Negative, no acute cardiopulmonary abnormality.

## 2011-04-23 ENCOUNTER — Other Ambulatory Visit: Payer: Self-pay | Admitting: *Deleted

## 2011-04-23 NOTE — Telephone Encounter (Signed)
States she is out of her complera. Wanted samples. Told her we did not have any. She has already called her pharmacy & she will have it in 2 days

## 2011-04-24 ENCOUNTER — Ambulatory Visit (INDEPENDENT_AMBULATORY_CARE_PROVIDER_SITE_OTHER): Payer: BC Managed Care – PPO | Admitting: Internal Medicine

## 2011-04-24 ENCOUNTER — Ambulatory Visit: Payer: BC Managed Care – PPO

## 2011-04-24 ENCOUNTER — Encounter: Payer: Self-pay | Admitting: Internal Medicine

## 2011-04-24 VITALS — BP 142/93 | HR 71 | Temp 97.8°F | Ht 66.5 in | Wt 205.0 lb

## 2011-04-24 DIAGNOSIS — B20 Human immunodeficiency virus [HIV] disease: Secondary | ICD-10-CM

## 2011-04-24 MED ORDER — TRIAMCINOLONE ACETONIDE 0.1 % EX CREA: TOPICAL_CREAM | Freq: Two times a day (BID) | CUTANEOUS | Status: DC

## 2011-04-24 NOTE — Assessment & Plan Note (Signed)
This patient is tolerating her regimen well. Unfortunately however, she is unable to take PPIs such as Prilosec with her therapy and therefore I did discuss with her the options of either stopping the PPI or changing her medication therapy for HIV. At this time she has opted to continue with her Complera and stop the Prilosec. She also is aware to stop any other type of antacid therapy while taking Complera. She will let us know if she has any problems with acid reflux and would like to change therapy but at this time will otherwise followup with her in 3 months. I did encourage continued compliance and she does report 100% compliance. I also discussed condom use. She just is worried about her rash on her leg being eczema and I will give her a prescription of anti-eczema therapy with steroid. I do though think it was more consistent with with potentially Kaposi's and she is going to see a dermatologist. This likely will rule out was resolved with HIV therapy.

## 2011-04-24 NOTE — Patient Instructions (Signed)
Stop Prilosec and other antiacids

## 2011-04-24 NOTE — Progress Notes (Signed)
  Subjective:    Patient ID: Carla Thomas, female    DOB: 1966/02/26, 45 y.o.   MRN: [id number]  HPIthis patient comes in for followup after starting her new regimen of Complera for her HIV. She had started with a baseline viral load of 5500 which has now decreased to 58 with a CD4 count increasing from 400-610. She is tolerating it well and has no complaints. Her only complaint is a persistent rash on her right leg that seems to be improving some. She does state it itches a little bit but otherwise does not bother her. She does tell me she is going to a dermatologist for this. She does continue to take an antiacid medicine and otherwise has no complaints today    Review of Systems  Constitutional: Negative for fever, activity change and unexpected weight change.  HENT: Negative for trouble swallowing.   Respiratory: Negative for cough and shortness of breath.   Cardiovascular: Negative for leg swelling.  Gastrointestinal: Negative for nausea, diarrhea and constipation.  Genitourinary: Negative for dysuria, frequency and genital sores.  Musculoskeletal: Negative for myalgias and arthralgias.  Skin: Positive for rash.  Hematological: Negative for adenopathy.  Psychiatric/Behavioral: Negative for dysphoric mood.       Objective:   Physical Exam  Constitutional: She is oriented to person, place, and time. She appears well-developed and well-nourished. No distress.  HENT:  Mouth/Throat: Oropharynx is clear and moist. No oropharyngeal exudate.  Cardiovascular: Normal rate, regular rhythm and normal heart sounds.   No murmur heard. Pulmonary/Chest: Effort normal and breath sounds normal. No respiratory distress. She has no wheezes.  Abdominal: Soft. Bowel sounds are normal. There is no tenderness.  Lymphadenopathy:    She has no cervical adenopathy.  Neurological: She is alert and oriented to person, place, and time.  Skin: Skin is warm and dry.       Right shin with darkened area,  some improvement with previous.   Psychiatric: She has a normal mood and affect. Her behavior is normal.          Assessment & Plan:

## 2011-05-04 ENCOUNTER — Emergency Department (HOSPITAL_BASED_OUTPATIENT_CLINIC_OR_DEPARTMENT_OTHER)
Admission: EM | Admit: 2011-05-04 | Discharge: 2011-05-04 | Disposition: A | Discharge status: Home / Self Care | Payer: BC Managed Care – PPO | Attending: Emergency Medicine | Admitting: Emergency Medicine

## 2011-05-04 ENCOUNTER — Emergency Department (INDEPENDENT_AMBULATORY_CARE_PROVIDER_SITE_OTHER): Payer: BC Managed Care – PPO

## 2011-05-04 ENCOUNTER — Encounter (HOSPITAL_BASED_OUTPATIENT_CLINIC_OR_DEPARTMENT_OTHER): Payer: Self-pay | Admitting: *Deleted

## 2011-05-04 DIAGNOSIS — J45909 Unspecified asthma, uncomplicated: Secondary | ICD-10-CM | POA: Insufficient documentation

## 2011-05-04 DIAGNOSIS — I10 Essential (primary) hypertension: Secondary | ICD-10-CM | POA: Insufficient documentation

## 2011-05-04 DIAGNOSIS — Z79899 Other long term (current) drug therapy: Secondary | ICD-10-CM | POA: Insufficient documentation

## 2011-05-04 DIAGNOSIS — R05 Cough: Secondary | ICD-10-CM | POA: Insufficient documentation

## 2011-05-04 DIAGNOSIS — R059 Cough, unspecified: Secondary | ICD-10-CM

## 2011-05-04 DIAGNOSIS — R0789 Other chest pain: Secondary | ICD-10-CM

## 2011-05-04 MED ORDER — IPRATROPIUM BROMIDE 0.02 % IN SOLN
0.5000 mg | Freq: Once | RESPIRATORY_TRACT | Status: AC
  Administered 2011-05-04: 0.5 mg via RESPIRATORY_TRACT
  Filled 2011-05-04: qty 2.5

## 2011-05-04 MED ORDER — ALBUTEROL SULFATE (5 MG/ML) 0.5% IN NEBU
5.0000 mg | INHALATION_SOLUTION | Freq: Once | RESPIRATORY_TRACT | Status: AC
  Administered 2011-05-04: 5 mg via RESPIRATORY_TRACT
  Filled 2011-05-04: qty 1

## 2011-05-04 NOTE — ED Notes (Signed)
Patient states she has had a productive cough all week that she feels has moved to her chest, yellow sputum, sinus/chest congestion , no fever that she knows of, able to drink and eat, used albuterol inhaler around 8am, took mucinex D at 1600, no relief

## 2011-05-04 NOTE — ED Provider Notes (Signed)
Medical screening examination/treatment/procedure(s) were performed by non-physician practitioner and as supervising physician I was immediately available for consultation/collaboration.   Zarie Kosiba, MD 05/04/11 2142 

## 2011-05-04 NOTE — ED Provider Notes (Signed)
History     CSN: [id number] Arrival date & time: 05/04/2011  5:29 PM   First MD Initiated Contact with Patient 05/04/11 1737      Chief Complaint  Patient presents with  . Cough    (Consider location/radiation/quality/duration/timing/severity/associated sxs/prior treatment) Patient is a 45 y.o. female presenting with cough. The history is provided by the patient. No language interpreter was used.  Cough This is a new problem. The current episode started 2 days ago. The problem occurs constantly. The problem has not changed since onset.The cough is productive of sputum. There has been no fever. Associated symptoms include ear congestion. Pertinent negatives include no headaches, no sore throat and no shortness of breath. She has tried decongestants (asthma) for the symptoms. The treatment provided no relief. Her past medical history is significant for asthma.    Past Medical History  Diagnosis Date  . Anemia     iron deficiency  . Anxiety   . Asthma   . Gout   . Hypertension     Past Surgical History  Procedure Date  . Cesarean section   . Abdominal hysterectomy 04/2006    partial for fibroids    Family History  Problem Relation Age of Onset  . Cancer Brother     lung CA  . Cancer Maternal Grandmother     breast CA    History  Substance Use Topics  . Smoking status: Former Smoker -- 0.3 packs/day    Types: Cigarettes  . Smokeless tobacco: Never Used   Comment: pt. recently started smoking again . 2 cigs per day , tying to quit for 3 weeks   . Alcohol Use: No    OB History    Grav Para Term Preterm Abortions TAB SAB Ect Mult Living                  Review of Systems  HENT: Negative for sore throat.   Respiratory: Positive for cough. Negative for shortness of breath.   Neurological: Negative for headaches.  All other systems reviewed and are negative.    Allergies  Codeine; Varenicline tartrate; Vitamin e; and Amoxicillin  Home Medications    Current Outpatient Rx  Name Route Sig Dispense Refill  . ACETAMINOPHEN 325 MG PO TABS Oral Take 2 tablets (650 mg total) by mouth every 4 (four) hours as needed for pain. 60 tablet 0  . ALBUTEROL SULFATE HFA 108 (90 BASE) MCG/ACT IN AERS Inhalation Inhale 2 puffs into the lungs every 4 (four) hours as needed for wheezing. 1 Inhaler 11  . EMTRICITAB-RILPIVIR-TENOFOVIR 200-25-300 MG PO TABS Oral Take 1 tablet by mouth daily. 30 tablet 5  . FLUTICASONE PROPIONATE 50 MCG/ACT NA SUSP Nasal Place 2 sprays into the nose daily as needed. 16 g 11  . HYDROCHLOROTHIAZIDE 25 MG PO TABS Oral Take 1 tablet (25 mg total) by mouth daily. 90 tablet 3  . TRIAMCINOLONE ACETONIDE 0.1 % EX CREA Topical Apply topically 2 (two) times daily. 30 g 0    BP 129/87  Pulse 79  Temp 98.7 F (37.1 C)  Resp 19  SpO2 100%  Physical Exam  Nursing note and vitals reviewed. Constitutional: She is oriented to person, place, and time. She appears well-developed and well-nourished.  HENT:  Head: Normocephalic and atraumatic.  Right Ear: External ear normal.  Left Ear: External ear normal.  Eyes: Conjunctivae and EOM are normal.  Neck: Neck supple.  Cardiovascular: Normal rate and regular rhythm.   Pulmonary/Chest: Effort normal and  breath sounds normal.  Musculoskeletal: Normal range of motion.  Neurological: She is alert and oriented to person, place, and time.    ED Course  Procedures (including critical care time)  Labs Reviewed - No data to display Dg Chest 2 View  05/04/2011  *RADIOLOGY REPORT*  Clinical Data:  Cough, left-sided chest tightness.  CHEST - 2 VIEW  Comparison: 07/13/2009  Findings: The heart size and mediastinal contours are within normal limits.  Both lungs are clear.  The visualized skeletal structures are unremarkable.  IMPRESSION: No active disease.  Original Report Authenticated By: Reola Calkins, M.D.     1. Cough       MDM  No acute finding noted on x-ray:pt is okay to  continue otc medications:no antibiotics needed at this time        Klohe Lower, NP 05/04/11 1850

## 2011-05-12 ENCOUNTER — Ambulatory Visit
Admission: RE | Admit: 2011-05-12 | Discharge: 2011-05-12 | Disposition: A | Discharge status: Home / Self Care | Payer: BC Managed Care – PPO | Source: Ambulatory Visit | Attending: Family Medicine | Admitting: Family Medicine

## 2011-05-12 DIAGNOSIS — Z1231 Encounter for screening mammogram for malignant neoplasm of breast: Secondary | ICD-10-CM

## 2011-05-16 ENCOUNTER — Encounter: Payer: Self-pay | Admitting: *Deleted

## 2011-06-02 ENCOUNTER — Ambulatory Visit (INDEPENDENT_AMBULATORY_CARE_PROVIDER_SITE_OTHER): Payer: BC Managed Care – PPO | Admitting: Family Medicine

## 2011-06-02 ENCOUNTER — Encounter: Payer: Self-pay | Admitting: Family Medicine

## 2011-06-02 VITALS — BP 132/78 | HR 76 | Temp 97.9°F | Ht 66.5 in | Wt 205.8 lb

## 2011-06-02 DIAGNOSIS — Z87891 Personal history of nicotine dependence: Secondary | ICD-10-CM | POA: Insufficient documentation

## 2011-06-02 DIAGNOSIS — Z0000 Encounter for general adult medical examination without abnormal findings: Secondary | ICD-10-CM

## 2011-06-02 DIAGNOSIS — I10 Essential (primary) hypertension: Secondary | ICD-10-CM

## 2011-06-02 DIAGNOSIS — F17200 Nicotine dependence, unspecified, uncomplicated: Secondary | ICD-10-CM

## 2011-06-02 DIAGNOSIS — E7800 Pure hypercholesterolemia, unspecified: Secondary | ICD-10-CM

## 2011-06-02 LAB — COMPREHENSIVE METABOLIC PANEL
AST: 16 U/L (ref 0–37)
Albumin: 4.2 g/dL (ref 3.5–5.2)
Alkaline Phosphatase: 46 U/L (ref 39–117)
BUN: 15 mg/dL (ref 6–23)
Calcium: 9.1 mg/dL (ref 8.4–10.5)
Chloride: 103 mEq/L (ref 96–112)
Creatinine, Ser: 0.8 mg/dL (ref 0.4–1.2)
Glucose, Bld: 94 mg/dL (ref 70–99)
Potassium: 3.7 mEq/L (ref 3.5–5.1)

## 2011-06-02 LAB — LIPID PANEL
Cholesterol: 149 mg/dL (ref 0–200)
LDL Cholesterol: 99 mg/dL (ref 0–99)
Triglycerides: 41 mg/dL (ref 0.0–149.0)
VLDL: 8.2 mg/dL (ref 0.0–40.0)

## 2011-06-02 LAB — TSH: TSH: 1.73 u[IU]/mL (ref 0.35–5.50)

## 2011-06-02 MED ORDER — VARENICLINE TARTRATE 1 MG PO TABS: 1.0000 mg | ORAL_TABLET | Freq: Every day | ORAL | Status: DC

## 2011-06-02 NOTE — Assessment & Plan Note (Signed)
This remains in good control with hctz and good habits bp in fair control at this time  No changes needed  Disc lifstyle change with low sodium diet and exercise   Lab today

## 2011-06-02 NOTE — Assessment & Plan Note (Signed)
Reviewed health habits including diet and exercise and skin cancer prevention Also reviewed health mt list, fam hx and immunizations  Will return for gyn and breast exam Some wellness labs today Mam utd  Good habits Doing well with hiv tx  Will try to quit smoking

## 2011-06-02 NOTE — Patient Instructions (Signed)
Labs today  Try chantix -- 1 pill daily or 1/2 pill twice daily and see how you tolerate it  (I sent to your pharmacy) If it causes depression - stop it and let me know  Schedule gyn exam - any 15 minute visit is fine  Take care of yourself  Try to keep exercising

## 2011-06-02 NOTE — Assessment & Plan Note (Signed)
Labs today for cholesterol  Disc goals for lipids and reasons to control them Rev labs with pt Rev low sat fat diet in detail  Disc low sat fat diet - does well with this and exercise

## 2011-06-02 NOTE — Progress Notes (Signed)
Subjective:    Patient ID: Carla Thomas, female    DOB: 1965-10-27, 45 y.o.   MRN: [id number]  HPI Here for health maintenance exam and to review chronic medical problems   Is doing well overall  No compliants - nothing new  One bout of bronchitis - then got better quickly after one nmt  T cell count with HIV is doing well    Wt is stable with bmi of 32  Partial hyst in 07 for fibroids Last pap has not had one in 4-5 years  She declines pap - but will come back with it    Hx of high chol Lab Results  Component Value Date   CHOL 165 02/04/2011   HDL 46 02/04/2011   LDLCALC 108* 02/04/2011   LDLDIRECT 154.6 12/16/2010   TRIG 57 02/04/2011   CHOLHDL 3.6 02/04/2011   due for lab Diet - has been quite good overall  Is exercising and doing well with that   Wants to quit smoking - wants to try chantix  Is very motivated and has quit before    bp is  132/78   Today No cp or palpitations or headaches or edema  No side effects to medicines   hctz works well  Had mam 12/12 normal  Self exam - once per month No lumps or new findings  Will come back for breast and pelvic exam   utd on imms  Also pneumovax   Patient Active Problem List  Diagnoses  . FIBROIDS, UTERUS  . GOITER  . GRAVE'S DISEASE  . GOUT  . ANEMIA-IRON DEFICIENCY  . ANXIETY  . HYPERTENSION  . RHINITIS, ALLERGIC NEC  . ASTHMA  . DYSPEPSIA  . ECZEMA  . PLANTAR FASCIITIS  . PALPITATIONS  . HYPERCHOLESTEROLEMIA  . Routine general medical examination at a health care facility  . Hypokalemia  . Leukopenia  . Pancreatitis  . Other screening mammogram  . Exposure to STD  . HIV disease  . Lump in neck  . Smoker   Past Medical History  Diagnosis Date  . Anemia     iron deficiency  . Anxiety   . Asthma   . Gout   . Hypertension    Past Surgical History  Procedure Date  . Cesarean section   . Abdominal hysterectomy 04/2006    partial for fibroids   History  Substance Use Topics  .  Smoking status: Current Everyday Smoker -- 0.3 packs/day    Types: Cigarettes  . Smokeless tobacco: Never Used   Comment: pt. recently started smoking again . 2 cigs per day , tying to quit for 3 weeks   . Alcohol Use: No   Family History  Problem Relation Age of Onset  . Cancer Brother     lung CA  . Cancer Maternal Grandmother     breast CA   Allergies  Allergen Reactions  . Codeine     REACTION: headaches  . Shellfish Allergy     Irritates gout  . Varenicline Tartrate     "Made me crazy"  . Vitamin E     Hair loss  . Amoxicillin Rash   Current Outpatient Prescriptions on File Prior to Visit  Medication Sig Dispense Refill  . albuterol (PROVENTIL) (2.5 MG/3ML) 0.083% nebulizer solution Take 2.5 mg by nebulization once.        . calcium carbonate (OS-CAL - DOSED IN MG OF ELEMENTAL CALCIUM) 1250 MG tablet Take 1 tablet by mouth daily.        Marland Kitchen  cholecalciferol (VITAMIN D) 400 UNITS TABS Take 400 Units by mouth daily.        . Emtricitab-Rilpivir-Tenofovir (COMPLERA) 200-25-300 MG TABS Take 1 tablet by mouth daily.  30 tablet  5  . fluticasone (FLONASE) 50 MCG/ACT nasal spray Place 2 sprays into both nostrils daily as needed. For allergies       . hydrochlorothiazide 25 MG tablet Take 1 tablet (25 mg total) by mouth daily.  90 tablet  3  . triamcinolone cream (KENALOG) 0.1 % Apply topically 2 (two) times daily.  30 g  0  . acetaminophen (TYLENOL) 325 MG tablet Take 2 tablets (650 mg total) by mouth every 4 (four) hours as needed for pain.  60 tablet  0  . albuterol (PROAIR HFA) 108 (90 BASE) MCG/ACT inhaler Inhale 2 puffs into the lungs every 4 (four) hours as needed for wheezing.  1 Inhaler  11   Current Facility-Administered Medications on File Prior to Visit  Medication Dose Route Frequency Provider Last Rate Last Dose  . pneumococcal 23 valent vaccine (PNU-IMMUNE) injection 0.5 mL  0.5 mL Intramuscular Once Acey Lav, MD          Review of Systems Review of  Systems  Constitutional: Negative for fever, appetite change, fatigue and unexpected weight change.  Eyes: Negative for pain and visual disturbance.  Respiratory: Negative for cough and shortness of breath.   Cardiovascular: Negative for cp or palpitations    Gastrointestinal: Negative for nausea, diarrhea and constipation.  Genitourinary: Negative for urgency and frequency.  Skin: Negative for pallor or rash   Neurological: Negative for weakness, light-headedness, numbness and headaches.  Hematological: Negative for adenopathy. Does not bruise/bleed easily.  Psychiatric/Behavioral: Negative for dysphoric mood. The patient is not nervous/anxious.          Objective:   Physical Exam  Constitutional: She appears well-developed and well-nourished. No distress.       overwt and well appearing   HENT:  Head: Normocephalic and atraumatic.  Right Ear: External ear normal.  Left Ear: External ear normal.  Nose: Nose normal.  Mouth/Throat: Oropharynx is clear and moist.  Eyes: Conjunctivae and EOM are normal. Pupils are equal, round, and reactive to light. No scleral icterus.  Neck: Normal range of motion. Neck supple. No JVD present. Carotid bruit is not present. No thyromegaly present.  Cardiovascular: Normal rate, regular rhythm, normal heart sounds and intact distal pulses.  Exam reveals no gallop.   Pulmonary/Chest: Effort normal and breath sounds normal. No respiratory distress. She has no wheezes.       Diffusely distant bs   Abdominal: Soft. Bowel sounds are normal. She exhibits no distension and no mass.  Musculoskeletal: Normal range of motion. She exhibits no edema and no tenderness.  Lymphadenopathy:    She has no cervical adenopathy.  Neurological: She is alert. She has normal reflexes. No cranial nerve deficit. She exhibits normal muscle tone. Coordination normal.  Skin: Skin is warm and dry. No rash noted. No erythema. No pallor.  Psychiatric: She has a normal mood and  affect.          Assessment & Plan:

## 2011-06-02 NOTE — Assessment & Plan Note (Signed)
Disc in detail risks of smoking and possible outcomes including copd, vascular/ heart disease, cancer , respiratory and sinus infections  Pt voices understanding Pt wants to try chantix again- at a lower dose to see if less side eff/ dreams/ etc  Will try 1 mg daily  Update if side eff- and knows to stop it if it causes dep

## 2011-06-09 ENCOUNTER — Ambulatory Visit: Payer: BC Managed Care – PPO | Admitting: Family Medicine

## 2011-07-07 ENCOUNTER — Ambulatory Visit: Payer: BC Managed Care – PPO | Admitting: Family Medicine

## 2011-07-16 ENCOUNTER — Other Ambulatory Visit: Payer: BC Managed Care – PPO

## 2011-07-16 DIAGNOSIS — B20 Human immunodeficiency virus [HIV] disease: Secondary | ICD-10-CM

## 2011-07-16 LAB — CBC WITH DIFFERENTIAL/PLATELET
Eosinophils Absolute: 0.2 10*3/uL (ref 0.0–0.7)
Eosinophils Relative: 3 % (ref 0–5)
HCT: 42 % (ref 36.0–46.0)
Hemoglobin: 14.2 g/dL (ref 12.0–15.0)
Lymphocytes Relative: 26 % (ref 12–46)
Lymphs Abs: 1.5 10*3/uL (ref 0.7–4.0)
MCH: 31.1 pg (ref 26.0–34.0)
MCV: 92.1 fL (ref 78.0–100.0)
Monocytes Absolute: 0.4 10*3/uL (ref 0.1–1.0)
Monocytes Relative: 7 % (ref 3–12)
RBC: 4.56 MIL/uL (ref 3.87–5.11)
WBC: 5.6 10*3/uL (ref 4.0–10.5)

## 2011-07-16 LAB — COMPLETE METABOLIC PANEL WITH GFR
ALT: 12 U/L (ref 0–35)
AST: 22 U/L (ref 0–37)
Alkaline Phosphatase: 45 U/L (ref 39–117)
BUN: 14 mg/dL (ref 6–23)
Chloride: 100 mEq/L (ref 96–112)
Creat: 1.02 mg/dL (ref 0.50–1.10)

## 2011-07-18 LAB — HIV-1 RNA QUANT-NO REFLEX-BLD
HIV 1 RNA Quant: <20 copies/mL (ref <20)
HIV-1 RNA Quant, Log: <1.3 {Log} (ref <1.30)

## 2011-07-31 ENCOUNTER — Ambulatory Visit (INDEPENDENT_AMBULATORY_CARE_PROVIDER_SITE_OTHER): Payer: BC Managed Care – PPO | Admitting: Internal Medicine

## 2011-07-31 ENCOUNTER — Encounter: Payer: Self-pay | Admitting: Internal Medicine

## 2011-07-31 DIAGNOSIS — Z113 Encounter for screening for infections with a predominantly sexual mode of transmission: Secondary | ICD-10-CM

## 2011-07-31 DIAGNOSIS — I10 Essential (primary) hypertension: Secondary | ICD-10-CM

## 2011-07-31 DIAGNOSIS — B20 Human immunodeficiency virus [HIV] disease: Secondary | ICD-10-CM

## 2011-07-31 NOTE — Patient Instructions (Signed)
folllw up in 4 months, labs 2 weeks before.  Follow up with Dr. Milinda Antis for Pap smear, Hypertension

## 2011-07-31 NOTE — Assessment & Plan Note (Signed)
It is elevated today and she does endorse some mild edema.  She is to call her PCP for management of this.

## 2011-07-31 NOTE — Assessment & Plan Note (Signed)
She continues to do very well with the Complera and I did remind her not to take any antiacid medicine.  She had purchased a box of famotidine but she is also unable to take that.  I reminded her that if she has significant GERD symptoms we can change her ARV to allow her to take antireflux meds.  However as it is working so well and well tolerated, she is happy to continue with the same.  If she does change ARV, she may need to stop fluticasone though.     I will defer cholesterol, Pap smear and other screening exams to her primary physician.

## 2011-07-31 NOTE — Progress Notes (Signed)
  Subjective:    Patient ID: Carla Thomas, female    DOB: 1966-02-17, 46 y.o.   MRN: [id number]  HPI here for follow up of 042.  She was started on Complera about 8 months ago and has had excellent compliance and adherence to her regimen.  She has some small spots on her skin she attributes to the medicine.  She has had no hospitalizations or other issues.  She does have some occasional issues with GERD but continues to do well without any ppi, H2 blocker or tums, which she can't take while on Complera.  She is due for a Pap smear and has had a mammogram.      Review of Systems  Constitutional: Negative for fever, appetite change, fatigue and unexpected weight change.  HENT: Negative for sore throat and trouble swallowing.   Respiratory: Negative for chest tightness, shortness of breath and wheezing.   Cardiovascular: Positive for leg swelling. Negative for chest pain and palpitations.  Gastrointestinal: Negative for nausea, abdominal pain and diarrhea.  Musculoskeletal: Negative for myalgias and arthralgias.  Skin: Positive for rash. Negative for pallor.       Small pinpoint non blanching lesions  Neurological: Negative for dizziness, syncope, weakness and headaches.  Hematological: Negative for adenopathy.  Psychiatric/Behavioral: Negative for dysphoric mood. The patient is not nervous/anxious.        Objective:   Physical Exam  Constitutional: She is oriented to person, place, and time. She appears well-developed and well-nourished. No distress.  HENT:  Mouth/Throat: Oropharynx is clear and moist. No oropharyngeal exudate.  Cardiovascular: Normal rate, regular rhythm and normal heart sounds.  Exam reveals no gallop and no friction rub.   No murmur heard. Pulmonary/Chest: Effort normal and breath sounds normal. No respiratory distress. She has no wheezes. She has no rales.  Abdominal: Soft. Bowel sounds are normal. She exhibits no distension. There is no tenderness. There is no  rebound.  Lymphadenopathy:    She has no cervical adenopathy.  Neurological: She is alert and oriented to person, place, and time.  Skin: Skin is warm and dry. No rash noted. No erythema.  Psychiatric: She has a normal mood and affect. Her behavior is normal.          Assessment & Plan:

## 2011-08-21 ENCOUNTER — Other Ambulatory Visit: Payer: Self-pay | Admitting: Internal Medicine

## 2011-09-02 ENCOUNTER — Other Ambulatory Visit: Payer: Self-pay | Admitting: Internal Medicine

## 2011-09-02 DIAGNOSIS — Z113 Encounter for screening for infections with a predominantly sexual mode of transmission: Secondary | ICD-10-CM

## 2011-09-03 ENCOUNTER — Other Ambulatory Visit: Payer: Self-pay | Admitting: Internal Medicine

## 2011-09-03 DIAGNOSIS — Z113 Encounter for screening for infections with a predominantly sexual mode of transmission: Secondary | ICD-10-CM

## 2011-09-18 ENCOUNTER — Other Ambulatory Visit: Payer: Self-pay | Admitting: Family Medicine

## 2011-11-20 ENCOUNTER — Other Ambulatory Visit: Payer: BC Managed Care – PPO

## 2011-11-20 DIAGNOSIS — Z113 Encounter for screening for infections with a predominantly sexual mode of transmission: Secondary | ICD-10-CM

## 2011-11-20 DIAGNOSIS — B20 Human immunodeficiency virus [HIV] disease: Secondary | ICD-10-CM

## 2011-11-20 LAB — COMPREHENSIVE METABOLIC PANEL
AST: 18 U/L (ref 0–37)
Albumin: 4.5 g/dL (ref 3.5–5.2)
Alkaline Phosphatase: 46 U/L (ref 39–117)
BUN: 16 mg/dL (ref 6–23)
Glucose, Bld: 81 mg/dL (ref 70–99)
Potassium: 3.8 mEq/L (ref 3.5–5.3)
Sodium: 137 mEq/L (ref 135–145)
Total Bilirubin: 0.5 mg/dL (ref 0.3–1.2)

## 2011-11-20 LAB — CBC WITH DIFFERENTIAL/PLATELET
Basophils Absolute: 0 10*3/uL (ref 0.0–0.1)
Basophils Relative: 0 % (ref 0–1)
Eosinophils Absolute: 0.2 10*3/uL (ref 0.0–0.7)
Lymphs Abs: 1.4 10*3/uL (ref 0.7–4.0)
MCH: 30.8 pg (ref 26.0–34.0)
Neutrophils Relative %: 53 % (ref 43–77)
Platelets: 270 10*3/uL (ref 150–400)
RBC: 4.42 MIL/uL (ref 3.87–5.11)
RDW: 13.5 % (ref 11.5–15.5)

## 2011-11-21 LAB — T-HELPER CELL (CD4) - (RCID CLINIC ONLY)
CD4 % Helper T Cell: 41 % (ref 33–55)
CD4 T Cell Abs: 660 uL (ref 400–2700)

## 2011-11-21 LAB — T.PALLIDUM AB, TOTAL: T pallidum Antibodies (TP-PA): >8 S/CO — ABNORMAL HIGH (ref <0.90)

## 2011-11-24 LAB — HIV-1 RNA QUANT-NO REFLEX-BLD: HIV 1 RNA Quant: <20 copies/mL (ref <20)

## 2011-12-04 ENCOUNTER — Ambulatory Visit (INDEPENDENT_AMBULATORY_CARE_PROVIDER_SITE_OTHER): Payer: BC Managed Care – PPO | Admitting: Internal Medicine

## 2011-12-04 ENCOUNTER — Encounter: Payer: Self-pay | Admitting: Internal Medicine

## 2011-12-04 VITALS — BP 143/87 | HR 88 | Temp 98.3°F | Ht 68.5 in | Wt 203.0 lb

## 2011-12-04 DIAGNOSIS — F17200 Nicotine dependence, unspecified, uncomplicated: Secondary | ICD-10-CM

## 2011-12-04 DIAGNOSIS — B20 Human immunodeficiency virus [HIV] disease: Secondary | ICD-10-CM

## 2011-12-04 NOTE — Progress Notes (Signed)
  Subjective:    Patient ID: Carla Thomas, female    DOB: 16-Sep-1965, 46 y.o.   MRN: [id number]  HPI She comes in here for followup of 042. She was started on Complera in 2012 and continues to have 100% adherence. She has had no significant issues since her last visit. No recent hospitalizations. She is going to get her Pap smear from her primary physician next week. She does have a history of partial hysterectomy.   Review of Systems  Constitutional: Negative for fever, chills, fatigue and unexpected weight change.  HENT: Negative for sore throat and trouble swallowing.   Respiratory: Negative for cough and shortness of breath.   Cardiovascular: Negative for chest pain, palpitations and leg swelling.  Gastrointestinal: Negative for nausea, abdominal pain, diarrhea and constipation.  Musculoskeletal: Negative for myalgias, joint swelling and arthralgias.  Skin: Negative for rash.  Neurological: Negative for dizziness.  Hematological: Negative for adenopathy.  Psychiatric/Behavioral: Negative for dysphoric mood. The patient is not nervous/anxious.        Objective:   Physical Exam  Constitutional: She appears well-developed and well-nourished. No distress.  HENT:  Mouth/Throat: Oropharynx is clear and moist. No oropharyngeal exudate.  Cardiovascular: Normal rate, regular rhythm and normal heart sounds.  Exam reveals no gallop and no friction rub.   No murmur heard. Pulmonary/Chest: Effort normal and breath sounds normal. No respiratory distress. She has no wheezes. She has no rales.  Abdominal: Soft. Bowel sounds are normal. She exhibits no distension. There is no tenderness. There is no rebound.  Lymphadenopathy:    She has no cervical adenopathy.  Skin: Skin is warm and dry. No rash noted. No erythema.          Assessment & Plan:

## 2011-12-04 NOTE — Patient Instructions (Signed)
Get your flu shot from Dr. Milinda Antis in the Fall.

## 2011-12-04 NOTE — Assessment & Plan Note (Signed)
I encouraged smoking cessation. She does have Chantix prescribed by her primary physician. I encouraged her to consider taking that. I did discuss that long-term risks are much higher with smoking compared to HIV in somebody with a good regimen that is compliant. She voiced her understanding. She is considering taking the Chantix which she has not yet started

## 2011-12-04 NOTE — Assessment & Plan Note (Signed)
She continues to do well on her regimen of Complera. She was reminded to use condoms with all sexual activity. She does get her Pap smear and other primary issues addressed by her primary care physician.  I will defer management of her cholesterol, blood pressure and other issues to her primary physician. She can follow up in 6 months. She knows to call if she has any problems sooner.

## 2011-12-23 ENCOUNTER — Other Ambulatory Visit: Payer: Self-pay | Admitting: Licensed Clinical Social Worker

## 2011-12-23 DIAGNOSIS — B20 Human immunodeficiency virus [HIV] disease: Secondary | ICD-10-CM

## 2011-12-23 MED ORDER — EMTRICITAB-RILPIVIR-TENOFOV DF 200-25-300 MG PO TABS: 30.0000 | ORAL_TABLET | Freq: Every day | ORAL | Status: DC

## 2011-12-26 ENCOUNTER — Other Ambulatory Visit: Payer: Self-pay | Admitting: *Deleted

## 2011-12-26 DIAGNOSIS — B20 Human immunodeficiency virus [HIV] disease: Secondary | ICD-10-CM

## 2011-12-26 MED ORDER — EMTRICITAB-RILPIVIR-TENOFOV DF 200-25-300 MG PO TABS: 1.0000 | ORAL_TABLET | Freq: Every day | ORAL | Status: DC

## 2011-12-31 ENCOUNTER — Other Ambulatory Visit: Payer: Self-pay | Admitting: Family Medicine

## 2011-12-31 NOTE — Telephone Encounter (Signed)
Last Ov was 06/02/11, labs was 01/22/11. ok to refill HCTZ 25mg 

## 2011-12-31 NOTE — Telephone Encounter (Signed)
Will refill electronically She has lab appt scheduled

## 2012-01-01 ENCOUNTER — Other Ambulatory Visit: Payer: Self-pay | Admitting: *Deleted

## 2012-01-01 DIAGNOSIS — B20 Human immunodeficiency virus [HIV] disease: Secondary | ICD-10-CM

## 2012-01-01 MED ORDER — EMTRICITAB-RILPIVIR-TENOFOV DF 200-25-300 MG PO TABS: 1.0000 | ORAL_TABLET | Freq: Every day | ORAL | Status: DC

## 2012-03-09 ENCOUNTER — Ambulatory Visit (INDEPENDENT_AMBULATORY_CARE_PROVIDER_SITE_OTHER): Payer: BC Managed Care – PPO

## 2012-03-09 DIAGNOSIS — Z23 Encounter for immunization: Secondary | ICD-10-CM

## 2012-04-23 ENCOUNTER — Other Ambulatory Visit: Payer: Self-pay | Admitting: Family Medicine

## 2012-04-23 ENCOUNTER — Ambulatory Visit (INDEPENDENT_AMBULATORY_CARE_PROVIDER_SITE_OTHER): Payer: BC Managed Care – PPO | Admitting: Family Medicine

## 2012-04-23 ENCOUNTER — Encounter: Payer: Self-pay | Admitting: Family Medicine

## 2012-04-23 VITALS — BP 128/80 | HR 76 | Temp 98.2°F | Ht 68.5 in | Wt 204.5 lb

## 2012-04-23 DIAGNOSIS — Z87891 Personal history of nicotine dependence: Secondary | ICD-10-CM

## 2012-04-23 DIAGNOSIS — J019 Acute sinusitis, unspecified: Secondary | ICD-10-CM

## 2012-04-23 DIAGNOSIS — Z1231 Encounter for screening mammogram for malignant neoplasm of breast: Secondary | ICD-10-CM

## 2012-04-23 DIAGNOSIS — B9689 Other specified bacterial agents as the cause of diseases classified elsewhere: Secondary | ICD-10-CM | POA: Insufficient documentation

## 2012-04-23 MED ORDER — LEVOFLOXACIN 500 MG PO TABS: 500.0000 mg | ORAL_TABLET | Freq: Every day | ORAL | Status: DC

## 2012-04-23 MED ORDER — ALBUTEROL SULFATE HFA 108 (90 BASE) MCG/ACT IN AERS: 2.0000 | INHALATION_SPRAY | Freq: Four times a day (QID) | RESPIRATORY_TRACT | Status: DC

## 2012-04-23 NOTE — Progress Notes (Signed)
Subjective:    Patient ID: Carla Thomas, female    DOB: [birth year], 46 y.o.   MRN: [id number]  HPI Here for sinus symptoms  Going on for about a month  Started as a head cold- then went in to her chest  Then went back up to her head Sinuses are full -- and pain in the face - worse above her eyes too   Is using nasal spray  Is blowing out green and yellow d/c No fever  Taking mucinex DM    bp is up today- did just drink coffee  BP Readings from Last 3 Encounters:  04/23/12 152/96  12/04/11 143/87  07/31/11 143/95    Re check 128/80  Patient Active Problem List  Diagnosis  . FIBROIDS, UTERUS  . GOITER  . GRAVE'S DISEASE  . GOUT  . ANEMIA-IRON DEFICIENCY  . ANXIETY  . HYPERTENSION  . RHINITIS, ALLERGIC NEC  . ASTHMA  . DYSPEPSIA  . ECZEMA  . PLANTAR FASCIITIS  . PALPITATIONS  . HYPERCHOLESTEROLEMIA  . Routine general medical examination at a health care facility  . Hypokalemia  . Leukopenia  . Pancreatitis  . Other screening mammogram  . Exposure to STD  . HIV disease  . Lump in neck  . Smoker   Past Medical History  Diagnosis Date  . Anemia     iron deficiency  . Anxiety   . Asthma   . Gout   . Hypertension    Past Surgical History  Procedure Date  . Cesarean section   . Abdominal hysterectomy 04/2006    partial for fibroids   History  Substance Use Topics  . Smoking status: Former Smoker    Types: Cigarettes  . Smokeless tobacco: Never Used     Comment: stop smoking in Sept  . Alcohol Use: No   Family History  Problem Relation Age of Onset  . Cancer Brother     lung CA  . Cancer Maternal Grandmother     breast CA   Allergies  Allergen Reactions  . Codeine     REACTION: headaches  . Shellfish Allergy     Irritates gout  . Varenicline Tartrate     "Made me crazy"  . Vitamin E     Hair loss  . Amoxicillin Rash   Current Outpatient Prescriptions on File Prior to Visit  Medication Sig Dispense Refill  . calcium carbonate  (OS-CAL - DOSED IN MG OF ELEMENTAL CALCIUM) 1250 MG tablet Take 1 tablet by mouth daily.        . cholecalciferol (VITAMIN D) 400 UNITS TABS Take 400 Units by mouth daily.        . Emtricitab-Rilpivir-Tenofovir (COMPLERA) 200-25-300 MG TABS Take 1 tablet by mouth daily.  90 tablet  3  . fluticasone (FLONASE) 50 MCG/ACT nasal spray Place 2 sprays into both nostrils daily as needed. For allergies       . hydrochlorothiazide (HYDRODIURIL) 25 MG tablet TAKE 1 TABLET EVERY MORNING  90 tablet  2  . PROAIR HFA 108 (90 BASE) MCG/ACT inhaler INHALE TWO PUFFS EVERY 4 HOURS AS NEEDED FOR WHEEZING  9 g  11  . albuterol (PROVENTIL) (2.5 MG/3ML) 0.083% nebulizer solution Take 2.5 mg by nebulization once.         Current Facility-Administered Medications on File Prior to Visit  Medication Dose Route Frequency Provider Last Rate Last Dose  . pneumococcal 23 valent vaccine (PNU-IMMUNE) injection 0.5 mL  0.5 mL Intramuscular Once Rich Reining  Dam, MD         Review of Systems Review of Systems  Constitutional: Negative for fever, appetite change,  and unexpected weight change.  ENT pos for cong and rhinorrhea and sinus pain  Eyes: Negative for pain and visual disturbance.  Respiratory: Negative for wheeze and shortness of breath.   Cardiovascular: Negative for cp or palpitations    Gastrointestinal: Negative for nausea, diarrhea and constipation.  Genitourinary: Negative for urgency and frequency.  Skin: Negative for pallor or rash   Neurological: Negative for weakness, light-headedness, numbness and headaches.  Hematological: Negative for adenopathy. Does not bruise/bleed easily.  Psychiatric/Behavioral: Negative for dysphoric mood. The patient is not nervous/anxious.         Objective:   Physical Exam  Constitutional: She appears well-developed and well-nourished. No distress.  HENT:  Head: Normocephalic and atraumatic.  Right Ear: External ear normal.  Left Ear: External ear normal.    Mouth/Throat: Oropharynx is clear and moist. No oropharyngeal exudate.       Nares are injected and congested  Bilateral maxillary sinus tenderness  Eyes: Conjunctivae normal and EOM are normal. Pupils are equal, round, and reactive to light. Right eye exhibits no discharge. Left eye exhibits no discharge.  Neck: Normal range of motion. Neck supple.  Cardiovascular: Normal rate and regular rhythm.   Pulmonary/Chest: Effort normal and breath sounds normal. No respiratory distress. She has no wheezes. She has no rales.  Lymphadenopathy:    She has no cervical adenopathy.  Neurological: She is alert. No cranial nerve deficit.  Skin: Skin is warm and dry. No rash noted. No erythema.  Psychiatric: She has a normal mood and affect.          Assessment & Plan:

## 2012-04-23 NOTE — Assessment & Plan Note (Signed)
After 1 mo of uri symptoms tx with levaquin - is pcn all Disc symptomatic care - see instructions on AVS  Update if not starting to improve in a week or if worsening

## 2012-04-23 NOTE — Patient Instructions (Signed)
Take levaquin as directed for sinus infection Stop afrin- use nasal saline spray over the counter instead  Drink lots of fluids and breathe steam  Great job with stopping smoking  bp was better the 2nd check  Update if not starting to improve in a week or if worsening

## 2012-04-23 NOTE — Assessment & Plan Note (Signed)
Commended on quitting

## 2012-05-12 ENCOUNTER — Other Ambulatory Visit: Payer: BC Managed Care – PPO

## 2012-05-12 DIAGNOSIS — B20 Human immunodeficiency virus [HIV] disease: Secondary | ICD-10-CM

## 2012-05-12 LAB — COMPLETE METABOLIC PANEL WITH GFR
Albumin: 4.8 g/dL (ref 3.5–5.2)
CO2: 31 mEq/L (ref 19–32)
Calcium: 9.8 mg/dL (ref 8.4–10.5)
Chloride: 100 mEq/L (ref 96–112)
GFR, Est African American: >89 mL/min
GFR, Est Non African American: >89 mL/min
Glucose, Bld: 88 mg/dL (ref 70–99)
Potassium: 4 mEq/L (ref 3.5–5.3)
Sodium: 142 mEq/L (ref 135–145)
Total Protein: 7.7 g/dL (ref 6.0–8.3)

## 2012-05-12 LAB — CBC WITH DIFFERENTIAL/PLATELET
HCT: 38.4 % (ref 36.0–46.0)
Hemoglobin: 13.4 g/dL (ref 12.0–15.0)
Lymphs Abs: 1.8 10*3/uL (ref 0.7–4.0)
Monocytes Relative: 8 % (ref 3–12)
Neutro Abs: 2.5 10*3/uL (ref 1.7–7.7)
Neutrophils Relative %: 51 % (ref 43–77)
RBC: 4.28 MIL/uL (ref 3.87–5.11)

## 2012-05-13 LAB — HIV-1 RNA QUANT-NO REFLEX-BLD
HIV 1 RNA Quant: <20 {copies}/mL
HIV-1 RNA Quant, Log: <1.3 {Log}

## 2012-05-25 ENCOUNTER — Other Ambulatory Visit: Payer: BC Managed Care – PPO

## 2012-05-25 ENCOUNTER — Encounter: Payer: Self-pay | Admitting: Internal Medicine

## 2012-05-25 ENCOUNTER — Ambulatory Visit (INDEPENDENT_AMBULATORY_CARE_PROVIDER_SITE_OTHER): Payer: BC Managed Care – PPO | Admitting: Internal Medicine

## 2012-05-25 VITALS — BP 133/96 | HR 81 | Temp 98.0°F | Ht 67.25 in | Wt 195.0 lb

## 2012-05-25 DIAGNOSIS — B20 Human immunodeficiency virus [HIV] disease: Secondary | ICD-10-CM

## 2012-05-25 NOTE — Assessment & Plan Note (Signed)
She continues to do very well and will continue with her Complera. She will return in 6 months. I will defer her lipid management to her primary physician. She does call she has any problems prior to the 6 month visit.

## 2012-05-25 NOTE — Progress Notes (Signed)
  Subjective:    Patient ID: Carla Thomas, female    DOB: [birth year], 46 y.o.   MRN: [id number]  HPI She comes in for followup of her HIV. She continues to take Complera and denies any missed doses. She has no recent issues. She does see her primary physician and gets her cholesterol and Pap smear from her.   Review of Systems  Constitutional: Negative for fever, chills and fatigue.  HENT: Negative for sore throat and trouble swallowing.   Respiratory: Negative for shortness of breath.   Cardiovascular: Negative for chest pain, palpitations and leg swelling.  Gastrointestinal: Negative for nausea and diarrhea.  Musculoskeletal: Negative for myalgias, joint swelling and arthralgias.       Objective:   Physical Exam  Constitutional: She appears well-developed and well-nourished. No distress.  HENT:  Mouth/Throat: No oropharyngeal exudate.  Cardiovascular: Normal rate, regular rhythm and normal heart sounds.  Exam reveals no gallop and no friction rub.   No murmur heard. Pulmonary/Chest: Effort normal and breath sounds normal. No respiratory distress. She has no wheezes. She has no rales.          Assessment & Plan:

## 2012-05-31 ENCOUNTER — Telehealth: Payer: Self-pay | Admitting: Family Medicine

## 2012-05-31 ENCOUNTER — Ambulatory Visit: Payer: BC Managed Care – PPO

## 2012-05-31 DIAGNOSIS — Z0000 Encounter for general adult medical examination without abnormal findings: Secondary | ICD-10-CM

## 2012-05-31 DIAGNOSIS — E7800 Pure hypercholesterolemia, unspecified: Secondary | ICD-10-CM

## 2012-05-31 NOTE — Telephone Encounter (Signed)
Message copied by Judy Pimple on Mon May 31, 2012  8:44 PM ------      Message from: Carla Thomas      Created: Tue May 25, 2012 12:53 PM      Regarding: Cpx labs Tues 12/24       Please order  future cpx labs for pt's upcoming lab appt.      Thanks      Rodney Booze

## 2012-06-03 ENCOUNTER — Other Ambulatory Visit: Payer: BC Managed Care – PPO

## 2012-06-08 ENCOUNTER — Ambulatory Visit (INDEPENDENT_AMBULATORY_CARE_PROVIDER_SITE_OTHER): Payer: BC Managed Care – PPO | Admitting: Family Medicine

## 2012-06-08 ENCOUNTER — Encounter: Payer: Self-pay | Admitting: Family Medicine

## 2012-06-08 ENCOUNTER — Ambulatory Visit: Payer: BC Managed Care – PPO | Admitting: Internal Medicine

## 2012-06-08 VITALS — BP 112/76 | HR 69 | Temp 98.2°F | Ht 67.25 in | Wt 197.2 lb

## 2012-06-08 DIAGNOSIS — I10 Essential (primary) hypertension: Secondary | ICD-10-CM

## 2012-06-08 DIAGNOSIS — E7800 Pure hypercholesterolemia, unspecified: Secondary | ICD-10-CM

## 2012-06-08 DIAGNOSIS — Z0000 Encounter for general adult medical examination without abnormal findings: Secondary | ICD-10-CM

## 2012-06-08 LAB — LIPID PANEL
LDL Cholesterol: 89 mg/dL (ref 0–99)
Total CHOL/HDL Ratio: 4
VLDL: 12 mg/dL (ref 0.0–40.0)

## 2012-06-08 NOTE — Patient Instructions (Addendum)
Don't forget to make your [facility] appt [facility]  Labs today Take care of yourself  Get enough sleep and exercise

## 2012-06-08 NOTE — Progress Notes (Signed)
Subjective:    Patient ID: Carla Thomas, female    DOB: 1965-09-26, 46 y.o.   MRN: [id number]  HPI Here for health maintenance exam and to review chronic medical problems    Is doing well overall  Very busy- is also loosing wt/ working a lot   HIV-no pain or symptoms or problems -and numbers are looking good   bp is stable today  No cp or palpitations or headaches or edema  No side effects to medicines  BP Readings from Last 3 Encounters:  06/08/12 112/76  05/25/12 133/96  04/23/12 128/80    Wt is up 2 lb with bmi of 30  Not sexually active at all  Had partial hyst in past  No symptoms at all -no dryness or bleeding  No exp to STD  mammo 12/12- is due for that  [facility] the [facility] Self exam no lumps or changes   Pneumovax 8/12  Had her flu shot   Hyperlipidemia Lab Results  Component Value Date   CHOL 149 06/02/2011   CHOL 165 02/04/2011   CHOL 201* 12/16/2010   Lab Results  Component Value Date   HDL 41.80 06/02/2011   HDL 46 02/04/2011   HDL 40.80 12/16/2010   Lab Results  Component Value Date   LDLCALC 99 06/02/2011   LDLCALC 108* 02/04/2011   LDLCALC 105* 11/12/2010   Lab Results  Component Value Date   TRIG 41.0 06/02/2011   TRIG 57 02/04/2011   TRIG 90.0 12/16/2010   Lab Results  Component Value Date   CHOLHDL 4 06/02/2011   CHOLHDL 3.6 02/04/2011   CHOLHDL 5 12/16/2010   Lab Results  Component Value Date   LDLDIRECT 154.6 12/16/2010    Lab Results  Component Value Date   TSH 1.73 06/02/2011  saw Dr Patrecia Pace   Mood is good   Patient Active Problem List  Diagnosis  . FIBROIDS, UTERUS  . GOITER  . GRAVE'S DISEASE  . GOUT  . ANEMIA-IRON DEFICIENCY  . ANXIETY  . HYPERTENSION  . RHINITIS, ALLERGIC NEC  . ASTHMA  . DYSPEPSIA  . ECZEMA  . PLANTAR FASCIITIS  . PALPITATIONS  . HYPERCHOLESTEROLEMIA  . Routine general medical examination at a health care facility  . Hypokalemia  . Leukopenia  . Pancreatitis  . Other screening  mammogram  . Exposure to STD  . HIV disease  . Lump in neck  . Former smoker  . Acute bacterial sinusitis   Past Medical History  Diagnosis Date  . Anemia     iron deficiency  . Anxiety   . Asthma   . Gout   . Hypertension    Past Surgical History  Procedure Date  . Cesarean section   . Abdominal hysterectomy 04/2006    partial for fibroids   History  Substance Use Topics  . Smoking status: Former Smoker    Types: Cigarettes  . Smokeless tobacco: Never Used     Comment: stop smoking in Sept  . Alcohol Use: No   Family History  Problem Relation Age of Onset  . Cancer Brother     lung CA  . Cancer Maternal Grandmother     breast CA   Allergies  Allergen Reactions  . Codeine     REACTION: headaches  . Shellfish Allergy     Irritates gout  . Varenicline Tartrate     "Made me crazy"  . Vitamin E     Hair loss  . Amoxicillin Rash  Current Outpatient Prescriptions on File Prior to Visit  Medication Sig Dispense Refill  . albuterol (PROAIR HFA) 108 (90 BASE) MCG/ACT inhaler Inhale 2 puffs into the lungs 4 (four) times daily.  1 Inhaler  11  . calcium carbonate (OS-CAL - DOSED IN MG OF ELEMENTAL CALCIUM) 1250 MG tablet Take 1 tablet by mouth daily.        . cholecalciferol (VITAMIN D) 400 UNITS TABS Take 400 Units by mouth daily.        . Emtricitab-Rilpivir-Tenofovir (COMPLERA) 200-25-300 MG TABS Take 1 tablet by mouth daily.  90 tablet  3  . fluticasone (FLONASE) 50 MCG/ACT nasal spray Place 2 sprays into both nostrils daily as needed. For allergies       . hydrochlorothiazide (HYDRODIURIL) 25 MG tablet TAKE 1 TABLET EVERY MORNING  90 tablet  2   Current Facility-Administered Medications on File Prior to Visit  Medication Dose Route Frequency Provider Last Rate Last Dose  . pneumococcal 23 valent vaccine (PNU-IMMUNE) injection 0.5 mL  0.5 mL Intramuscular Once Randall Hiss, MD          Review of Systems Review of Systems  Constitutional: Negative for  fever, appetite change, fatigue and unexpected weight change.  Eyes: Negative for pain and visual disturbance.  Respiratory: Negative for cough and shortness of breath.   Cardiovascular: Negative for cp or palpitations    Gastrointestinal: Negative for nausea, diarrhea and constipation.  Genitourinary: Negative for urgency and frequency.  Skin: Negative for pallor or rash   Neurological: Negative for weakness, light-headedness, numbness and headaches.  Hematological: Negative for adenopathy. Does not bruise/bleed easily.  Psychiatric/Behavioral: Negative for dysphoric mood. The patient is not nervous/anxious.         Objective:   Physical Exam  Constitutional: She appears well-developed and well-nourished. No distress.  HENT:  Head: Normocephalic and atraumatic.  Right Ear: External ear normal.  Left Ear: External ear normal.  Nose: Nose normal.  Mouth/Throat: Oropharynx is clear and moist.  Eyes: Conjunctivae normal and EOM are normal. Pupils are equal, round, and reactive to light. Right eye exhibits no discharge. Left eye exhibits no discharge. No scleral icterus.  Neck: Normal range of motion. Neck supple. No JVD present. Carotid bruit is not present. No thyromegaly present.  Cardiovascular: Normal rate, regular rhythm, normal heart sounds and intact distal pulses.  Exam reveals no gallop.   Pulmonary/Chest: Effort normal and breath sounds normal. No respiratory distress. She has no wheezes.  Abdominal: Soft. Bowel sounds are normal. She exhibits no distension, no abdominal bruit and no mass. There is no tenderness.  Genitourinary: No breast swelling, tenderness, discharge or bleeding.       Breast exam: No mass, nodules, thickening, tenderness, bulging, retraction, inflamation, nipple discharge or skin changes noted.  No axillary or clavicular LA.  Chaperoned exam.    Musculoskeletal: She exhibits no edema and no tenderness.  Lymphadenopathy:    She has no cervical adenopathy.    Neurological: She is alert. She has normal reflexes. No cranial nerve deficit. She exhibits normal muscle tone. Coordination normal.  Skin: Skin is warm and dry. No rash noted. No erythema. No pallor.  Psychiatric: She has a normal mood and affect.          Assessment & Plan:

## 2012-06-10 ENCOUNTER — Encounter: Payer: Self-pay | Admitting: *Deleted

## 2012-06-10 NOTE — Assessment & Plan Note (Signed)
Lipids today Rev low sat fat diet in detail    

## 2012-06-10 NOTE — Assessment & Plan Note (Signed)
BP: 112/76 mmHg   bp in fair control at this time  No changes needed  Disc lifstyle change with low sodium diet and exercise

## 2012-06-10 NOTE — Assessment & Plan Note (Signed)
Reviewed health habits including diet and exercise and skin cancer prevention Also reviewed health mt list, fam hx and immunizations  Wellness labs today 

## 2012-07-05 ENCOUNTER — Other Ambulatory Visit: Payer: Self-pay | Admitting: Family Medicine

## 2012-09-03 ENCOUNTER — Other Ambulatory Visit: Payer: Self-pay | Admitting: Family Medicine

## 2012-11-16 ENCOUNTER — Other Ambulatory Visit: Payer: BC Managed Care – PPO

## 2012-11-16 DIAGNOSIS — B20 Human immunodeficiency virus [HIV] disease: Secondary | ICD-10-CM

## 2012-11-30 ENCOUNTER — Ambulatory Visit (INDEPENDENT_AMBULATORY_CARE_PROVIDER_SITE_OTHER): Payer: BC Managed Care – PPO | Admitting: Internal Medicine

## 2012-11-30 ENCOUNTER — Encounter: Payer: Self-pay | Admitting: Internal Medicine

## 2012-11-30 VITALS — BP 130/87 | HR 68 | Temp 98.2°F | Ht 67.5 in | Wt 198.0 lb

## 2012-11-30 DIAGNOSIS — B20 Human immunodeficiency virus [HIV] disease: Secondary | ICD-10-CM

## 2012-11-30 DIAGNOSIS — Z113 Encounter for screening for infections with a predominantly sexual mode of transmission: Secondary | ICD-10-CM

## 2012-11-30 MED ORDER — EMTRICITAB-RILPIVIR-TENOFOV DF 200-25-300 MG PO TABS: 1.0000 | ORAL_TABLET | Freq: Every day | ORAL | Status: DC

## 2012-11-30 NOTE — Assessment & Plan Note (Signed)
She is doing well and will continue with the Complera. She does see her primary physician so can return here in one year for routine followup labs. If there any problems before the year she can call sooner and be seen in the time period

## 2012-11-30 NOTE — Progress Notes (Signed)
  Subjective:    Patient ID: Carla Thomas, female    DOB: 1965/11/10, 47 y.o.   MRN: [id number]  HPI She comes in for routine followup. She continues to take Complera and denies any missed doses. She feels well is working daily and works a lot in her garden. She feels very positive and happy with her lab results which show a CD4 count of 760 and an undetectable viral load. She has been undetectable now for several years. No complaints, she gets her lipids managed by her primary doctor and her Pap smear and mammogram as well.   Review of Systems  Constitutional: Negative for fever and fatigue.  HENT: Negative for sore throat and trouble swallowing.   Respiratory: Negative for cough and shortness of breath.   Cardiovascular: Negative for chest pain.  Gastrointestinal: Negative for nausea and diarrhea.  Musculoskeletal: Negative for myalgias and arthralgias.  Skin: Negative for rash.  Neurological: Negative for dizziness, light-headedness and headaches.  Hematological: Negative for adenopathy.  Psychiatric/Behavioral: Negative for dysphoric mood.       Objective:   Physical Exam  Constitutional: She appears well-developed and well-nourished. No distress.  HENT:  Mouth/Throat: No oropharyngeal exudate.  Cardiovascular: Normal rate, regular rhythm and normal heart sounds.   No murmur heard. Pulmonary/Chest: Effort normal and breath sounds normal. No respiratory distress. She has no wheezes.  Lymphadenopathy:    She has no cervical adenopathy.  Skin: Skin is warm and dry. No rash noted.          Assessment & Plan:

## 2013-02-03 IMAGING — CR DG CHEST 2V
2 series · 2 of 2 positions shown · non-contrast
Comparison: 07/13/2009

CLINICAL DATA: Cough, left-sided chest tightness.

CHEST - 2 VIEW

[w chest pa]
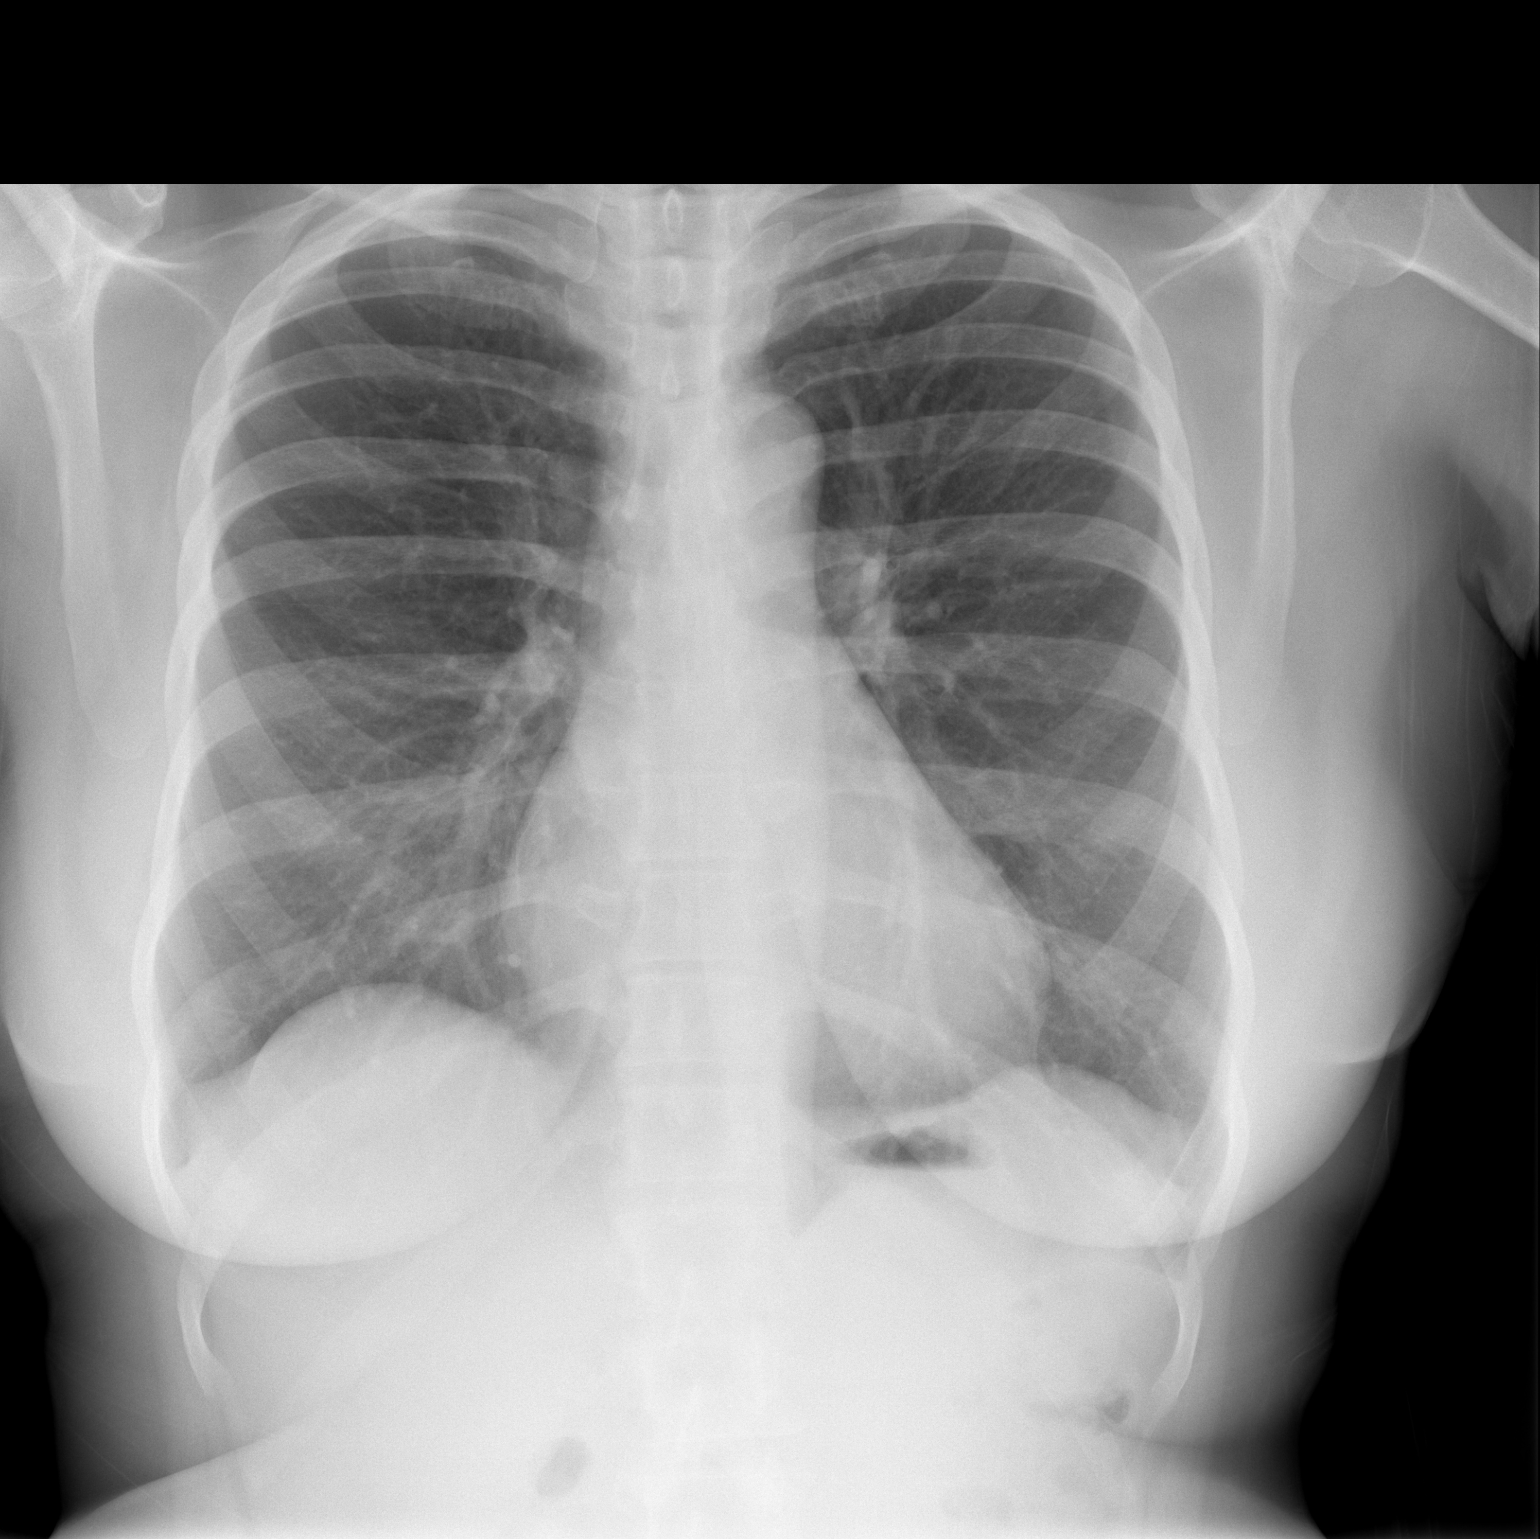

[w chest lat]
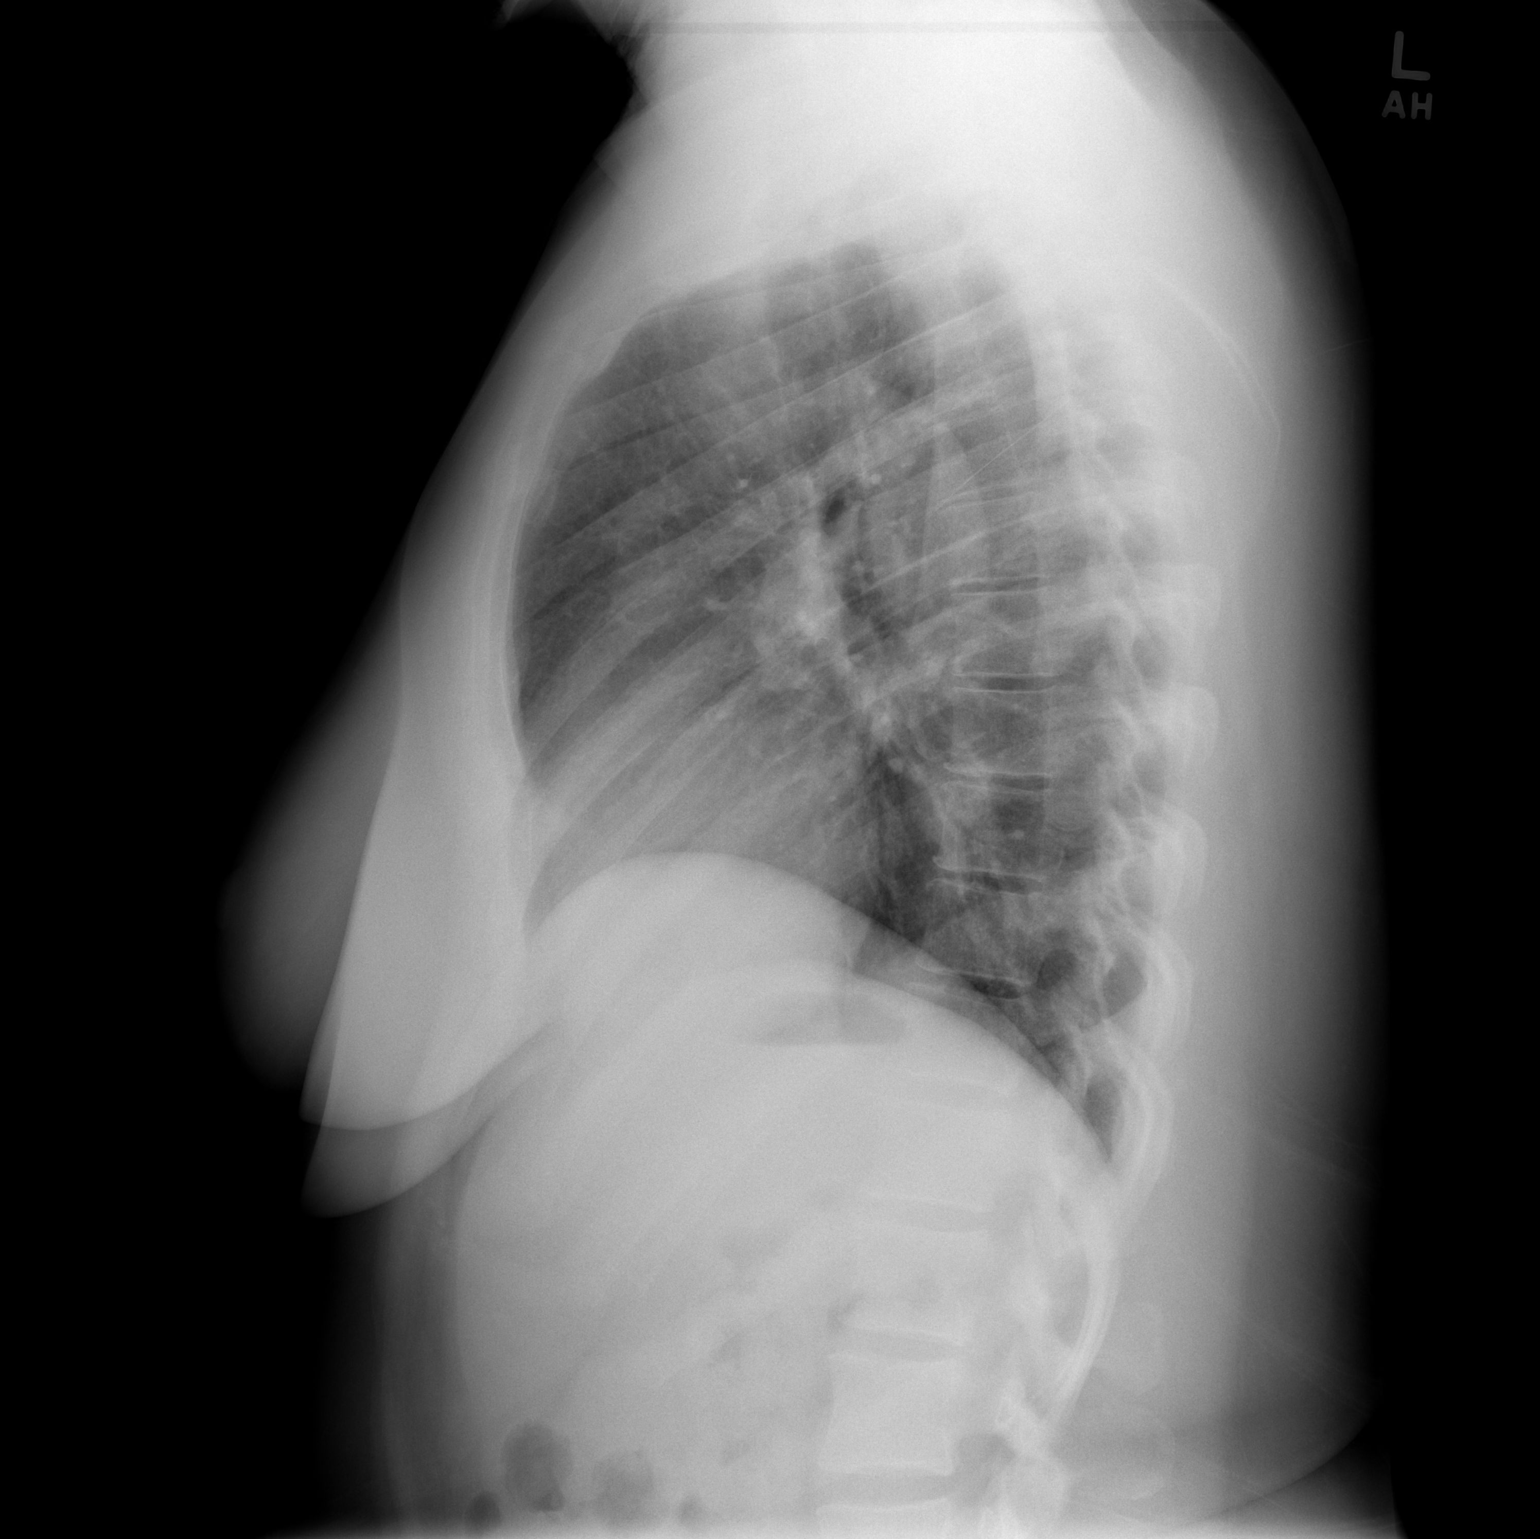

[2 of 2 positions shown; findings below may reference images not displayed]

FINDINGS: The heart size and mediastinal contours are within normal
limits.  Both lungs are clear.  The visualized skeletal structures
are unremarkable.
IMPRESSION: No active disease.

## 2013-03-21 ENCOUNTER — Ambulatory Visit (INDEPENDENT_AMBULATORY_CARE_PROVIDER_SITE_OTHER): Payer: BC Managed Care – PPO | Admitting: Family Medicine

## 2013-03-21 ENCOUNTER — Encounter: Payer: Self-pay | Admitting: Family Medicine

## 2013-03-21 VITALS — BP 122/86 | HR 66 | Temp 98.2°F | Ht 67.25 in | Wt 194.5 lb

## 2013-03-21 DIAGNOSIS — L0291 Cutaneous abscess, unspecified: Secondary | ICD-10-CM | POA: Insufficient documentation

## 2013-03-21 MED ORDER — LEVOFLOXACIN 500 MG PO TABS: 500.0000 mg | ORAL_TABLET | Freq: Every day | ORAL | Status: DC

## 2013-03-21 MED ORDER — SULFAMETHOXAZOLE-TMP DS 800-160 MG PO TABS: 1.0000 | ORAL_TABLET | Freq: Two times a day (BID) | ORAL | Status: DC

## 2013-03-21 NOTE — Assessment & Plan Note (Signed)
Of L buttock - pus easily expressed for wound cx  tx with bactrim and levaquin  Disc wound care-see AVS Will update with cx result and pt will call if symptoms worsen

## 2013-03-21 NOTE — Progress Notes (Signed)
Subjective:    Patient ID: Carla Thomas, female    DOB: 10-08-1965, 47 y.o.   MRN: [id number]  HPI  Has a bump on back  Started on Wednesday Was working out on the carpet-- got up and had an itchy spot -- got larger by Thursday  It has drained after coming to a head  Used beta dyne  A bit of pus   No fever - but feels lousy   LN in L groin are sore  Patient Active Problem List   Diagnosis Date Noted  . Abscess 03/21/2013  . Acute bacterial sinusitis 04/23/2012  . Former smoker 06/02/2011  . Lump in neck 02/26/2011  . HIV disease 01/05/2011  . Exposure to STD 12/16/2010  . Other screening mammogram 11/12/2010  . Hypokalemia 11/07/2010  . Leukopenia 11/07/2010  . Pancreatitis 11/07/2010  . Routine general medical examination at a health care facility 11/04/2010  . HYPERCHOLESTEROLEMIA 06/12/2010  . DYSPEPSIA 07/18/2009  . PALPITATIONS 07/18/2009  . FIBROIDS, UTERUS 10/16/2006  . GOITER 10/16/2006  . GRAVE'S DISEASE 10/16/2006  . GOUT 10/16/2006  . ANEMIA-IRON DEFICIENCY 10/16/2006  . ANXIETY 10/16/2006  . HYPERTENSION 10/16/2006  . RHINITIS, ALLERGIC NEC 10/16/2006  . ASTHMA 10/16/2006  . ECZEMA 10/16/2006  . PLANTAR FASCIITIS 10/16/2006   Past Medical History  Diagnosis Date  . Anemia     iron deficiency  . Anxiety   . Asthma   . Gout   . Hypertension    Past Surgical History  Procedure Laterality Date  . Cesarean section    . Abdominal hysterectomy  04/2006    partial for fibroids   History  Substance Use Topics  . Smoking status: Former Smoker    Types: Cigarettes  . Smokeless tobacco: Never Used     Comment: stop smoking in Sept  . Alcohol Use: No   Family History  Problem Relation Age of Onset  . Cancer Brother     lung CA  . Cancer Maternal Grandmother     breast CA   Allergies  Allergen Reactions  . Codeine     REACTION: headaches  . Shellfish Allergy     Irritates gout  . Varenicline Tartrate   . Vitamin E     Hair loss   . Amoxicillin Rash   Current Outpatient Prescriptions on File Prior to Visit  Medication Sig Dispense Refill  . albuterol (PROAIR HFA) 108 (90 BASE) MCG/ACT inhaler Inhale 2 puffs into the lungs 4 (four) times daily.  1 Inhaler  11  . calcium carbonate (OS-CAL - DOSED IN MG OF ELEMENTAL CALCIUM) 1250 MG tablet Take 1 tablet by mouth 2 (two) times daily.       . cholecalciferol (VITAMIN D) 400 UNITS TABS Take 400 Units by mouth daily.        . Emtricitab-Rilpivir-Tenofovir (COMPLERA) 200-25-300 MG TABS Take 1 tablet by mouth daily.  90 tablet  4  . fluticasone (FLONASE) 50 MCG/ACT nasal spray Place 2 sprays into the nose as needed for rhinitis.      . hydrochlorothiazide (HYDRODIURIL) 25 MG tablet TAKE 1 TABLET EVERY MORNING  90 tablet  1   Current Facility-Administered Medications on File Prior to Visit  Medication Dose Route Frequency Provider Last Rate Last Dose  . pneumococcal 23 valent vaccine (PNU-IMMUNE) injection 0.5 mL  0.5 mL Intramuscular Once Randall Hiss, MD         Review of Systems Review of Systems  Constitutional: Negative for fever, appetite change,  and unexpected weight change.  Eyes: Negative for pain and visual disturbance.  Respiratory: Negative for cough and shortness of breath.   Cardiovascular: Negative for cp or palpitations    Gastrointestinal: Negative for nausea, diarrhea and constipation.  Genitourinary: Negative for urgency and frequency.  Skin: Negative for pallor or rash  pos for lesion with drainage Neurological: Negative for weakness, light-headedness, numbness and headaches.  Hematological: Negative for adenopathy. Does not bruise/bleed easily.  Psychiatric/Behavioral: Negative for dysphoric mood. The patient is not nervous/anxious.         Objective:   Physical Exam  Constitutional: She appears well-developed and well-nourished. No distress.  HENT:  Head: Normocephalic and atraumatic.  Eyes: Conjunctivae and EOM are normal. Pupils are  equal, round, and reactive to light. Right eye exhibits no discharge. Left eye exhibits no discharge. No scleral icterus.  Cardiovascular: Normal rate and regular rhythm.   Skin: Skin is warm and dry. There is erythema.  1-2 cm soft abcess which is actively draining on upper L buttock - pus easily expressed from several openings and wound cx obt Dressed with triple abx oint and sterile gauze   Psychiatric: She has a normal mood and affect.          Assessment & Plan:

## 2013-03-21 NOTE — Patient Instructions (Signed)
Use gentle warm compresses on abscess  Dress with loose dressing and abx ointment  Update me if worse or if fever  We will call with wound culture results

## 2013-03-22 ENCOUNTER — Encounter (HOSPITAL_COMMUNITY): Payer: Self-pay | Admitting: Emergency Medicine

## 2013-03-22 ENCOUNTER — Emergency Department (HOSPITAL_COMMUNITY)
Admission: EM | Admit: 2013-03-22 | Discharge: 2013-03-22 | Disposition: A | Discharge status: Home / Self Care | Payer: BC Managed Care – PPO | Attending: Emergency Medicine | Admitting: Emergency Medicine

## 2013-03-22 DIAGNOSIS — R634 Abnormal weight loss: Secondary | ICD-10-CM | POA: Insufficient documentation

## 2013-03-22 DIAGNOSIS — J45909 Unspecified asthma, uncomplicated: Secondary | ICD-10-CM | POA: Insufficient documentation

## 2013-03-22 DIAGNOSIS — R5381 Other malaise: Secondary | ICD-10-CM | POA: Insufficient documentation

## 2013-03-22 DIAGNOSIS — Z8639 Personal history of other endocrine, nutritional and metabolic disease: Secondary | ICD-10-CM | POA: Insufficient documentation

## 2013-03-22 DIAGNOSIS — Z862 Personal history of diseases of the blood and blood-forming organs and certain disorders involving the immune mechanism: Secondary | ICD-10-CM | POA: Insufficient documentation

## 2013-03-22 DIAGNOSIS — Z87891 Personal history of nicotine dependence: Secondary | ICD-10-CM | POA: Insufficient documentation

## 2013-03-22 DIAGNOSIS — Z880 Allergy status to penicillin: Secondary | ICD-10-CM | POA: Insufficient documentation

## 2013-03-22 DIAGNOSIS — Z79899 Other long term (current) drug therapy: Secondary | ICD-10-CM | POA: Insufficient documentation

## 2013-03-22 DIAGNOSIS — I10 Essential (primary) hypertension: Secondary | ICD-10-CM | POA: Insufficient documentation

## 2013-03-22 DIAGNOSIS — L0231 Cutaneous abscess of buttock: Secondary | ICD-10-CM | POA: Insufficient documentation

## 2013-03-22 DIAGNOSIS — Z5189 Encounter for other specified aftercare: Secondary | ICD-10-CM

## 2013-03-22 DIAGNOSIS — E860 Dehydration: Secondary | ICD-10-CM | POA: Insufficient documentation

## 2013-03-22 DIAGNOSIS — Z21 Asymptomatic human immunodeficiency virus [HIV] infection status: Secondary | ICD-10-CM | POA: Insufficient documentation

## 2013-03-22 DIAGNOSIS — IMO0002 Reserved for concepts with insufficient information to code with codable children: Secondary | ICD-10-CM | POA: Insufficient documentation

## 2013-03-22 DIAGNOSIS — Z8659 Personal history of other mental and behavioral disorders: Secondary | ICD-10-CM | POA: Insufficient documentation

## 2013-03-22 DIAGNOSIS — M109 Gout, unspecified: Secondary | ICD-10-CM | POA: Insufficient documentation

## 2013-03-22 HISTORY — DX: Asymptomatic human immunodeficiency virus (hiv) infection status: Z21

## 2013-03-22 HISTORY — DX: Dehydration: E86.0

## 2013-03-22 HISTORY — DX: Human immunodeficiency virus (HIV) disease: B20

## 2013-03-22 HISTORY — DX: Hypokalemia: E87.6

## 2013-03-22 LAB — CBC WITH DIFFERENTIAL/PLATELET
Basophils Absolute: 0 10*3/uL (ref 0.0–0.1)
Basophils Relative: 0 % (ref 0–1)
Eosinophils Absolute: 0 10*3/uL (ref 0.0–0.7)
Hemoglobin: 14.5 g/dL (ref 12.0–15.0)
MCH: 32.2 pg (ref 26.0–34.0)
MCHC: 35.5 g/dL (ref 30.0–36.0)
Monocytes Relative: 6 % (ref 3–12)
Neutro Abs: 6.4 10*3/uL (ref 1.7–7.7)
Neutrophils Relative %: 78 % — ABNORMAL HIGH (ref 43–77)
Platelets: 267 10*3/uL (ref 150–400)
RBC: 4.5 MIL/uL (ref 3.87–5.11)
RDW: 12.1 % (ref 11.5–15.5)
WBC: 8.2 10*3/uL (ref 4.0–10.5)

## 2013-03-22 LAB — BASIC METABOLIC PANEL
Calcium: 9.9 mg/dL (ref 8.4–10.5)
Chloride: 99 mEq/L (ref 96–112)
GFR calc Af Amer: >90 mL/min (ref >90)
GFR calc non Af Amer: >90 mL/min (ref >90)
Glucose, Bld: 80 mg/dL (ref 70–99)
Potassium: 3.5 mEq/L (ref 3.5–5.1)
Sodium: 134 mEq/L — ABNORMAL LOW (ref 135–145)

## 2013-03-22 MED ORDER — SODIUM CHLORIDE 0.9 % IV BOLUS (SEPSIS): 1000.0000 mL | Freq: Once | INTRAVENOUS | Status: DC

## 2013-03-22 NOTE — ED Provider Notes (Signed)
CSN: [id number]     Arrival date & time 03/22/13  1259 History  This chart was scribed for non-physician practitioner working with Donnetta Hutching, MD by Ashley Jacobs, ED scribe. This patient was seen in room WTR6/WTR6 and the patient's care was started at 1:29 PM   First MD Initiated Contact with Patient 03/22/13 1305     Chief Complaint  Patient presents with  . Abscess    open abscess on coccyx   (Consider location/radiation/quality/duration/timing/severity/associated sxs/prior Treatment) Patient is a 47 y.o. female presenting with abscess. The history is provided by the patient and medical records. No language interpreter was used.  Abscess Location:  Ano-genital Ano-genital abscess location:  Gluteal cleft Abscess quality: draining and painful   Duration:  6 days Progression:  Worsening Pain details:    Severity:  Moderate   Duration:  6 days   Timing:  Constant   Progression:  Worsening Chronicity:  New Relieved by:  Nothing Worsened by:  Nothing tried Ineffective treatments:  Draining/squeezing and oral antibiotics Associated symptoms: fatigue   Associated symptoms: no fever and no nausea    HPI Comments: Carla Thomas is a 46 y.o. female who presents to the Emergency Department complaining of abscess on her right-sided, upper glueatal cleft that presented 6 days ago and is worse today.  She mentions the abcess erupted two days ago and drained clear fluids, however today the site is draining fluid similar to a "cottage cheese" consistency and has a darker apperance.Pt states the associated symptoms of itching, tenderness and erythema to the site for the past four days. Yesterday she states losing her appetite but she is able to drink fluids normally .Pt states feeling as though she experiencing dehydration and feels that her "fluids are depleted" and something is "blocking it". She reports loosing 3 lbs since Friday and is unable to work. She explains that she originally  thought she was bitten by an insect. She was seen at the ED by Dr. Roxy Manns for the same abcess yesterday and she was able to drain the site. Pt was prescribed Hydrodiuril 25 mg, Levfaquin 500 mg, and Bactrim 800-160 mg. Pt denies fever and myalgia. Past Medical History  Diagnosis Date  . Anemia     iron deficiency  . Anxiety   . Asthma   . Gout   . Hypertension   . HIV (human immunodeficiency virus infection)   . Potassium (K) deficiency   . Dehydration    Past Surgical History  Procedure Laterality Date  . Cesarean section    . Abdominal hysterectomy  04/2006    partial for fibroids   Family History  Problem Relation Age of Onset  . Cancer Brother     lung CA  . Cancer Maternal Grandmother     breast CA  . Heart failure Maternal Grandmother    History  Substance Use Topics  . Smoking status: Former Smoker    Types: Cigarettes  . Smokeless tobacco: Never Used     Comment: stop smoking in Sept  . Alcohol Use: Yes   OB History   Grav Para Term Preterm Abortions TAB SAB Ect Mult Living                 Review of Systems  Constitutional: Positive for fatigue and unexpected weight change. Negative for fever.  Gastrointestinal: Negative for nausea.  Musculoskeletal: Negative for myalgias.  Skin: Positive for wound.    Allergies  Codeine; Shellfish allergy; Varenicline tartrate; Vitamin e; and  Amoxicillin  Home Medications   Current Outpatient Rx  Name  Route  Sig  Dispense  Refill  . acetaminophen (TYLENOL) 500 MG tablet   Oral   Take 1,000 mg by mouth every 6 (six) hours as needed for pain.         Marland Kitchen albuterol (PROAIR HFA) 108 (90 BASE) MCG/ACT inhaler   Inhalation   Inhale 2 puffs into the lungs 4 (four) times daily.   1 Inhaler   11   . calcium carbonate (OS-CAL - DOSED IN MG OF ELEMENTAL CALCIUM) 1250 MG tablet   Oral   Take 1 tablet by mouth 2 (two) times daily.          . cholecalciferol (VITAMIN D) 400 UNITS TABS   Oral   Take 400 Units by  mouth daily.           . Emtricitab-Rilpivir-Tenofovir 200-25-300 MG TABS   Oral   Take 1 tablet by mouth daily.         . fluticasone (FLONASE) 50 MCG/ACT nasal spray   Nasal   Place 2 sprays into the nose as needed for rhinitis.         . hydrochlorothiazide (HYDRODIURIL) 25 MG tablet      TAKE 1 TABLET EVERY MORNING   90 tablet   1   . levofloxacin (LEVAQUIN) 500 MG tablet   Oral   Take 1 tablet (500 mg total) by mouth daily.   10 tablet   0   . sulfamethoxazole-trimethoprim (BACTRIM DS) 800-160 MG per tablet   Oral   Take 1 tablet by mouth 2 (two) times daily.   20 tablet   0    BP 113/77  Pulse 88  Temp(Src) 98.3 F (36.8 C) (Oral)  Resp 18  Wt 195 lb (88.451 kg)  BMI 30.32 kg/m2  SpO2 100% Physical Exam  Constitutional: She is oriented to person, place, and time. She appears well-developed and well-nourished.  HENT:  Mouth/Throat: Mucous membranes are dry.  Neck: Normal range of motion.  Pulmonary/Chest: Effort normal.  Neurological: She is alert and oriented to person, place, and time.  Skin: Skin is warm and dry.  Open abscess right buttock medially without induration, actively draining - minimal drainage now. Mildly tender.     ED Course  Procedures (including critical care time) DIAGNOSTIC STUDIES: Oxygen Saturation is 100% on room air, normal by my interpretation.    COORDINATION OF CARE: 1:35 Discussed course of care with pt . Pt understands and agrees. Labs Review Labs Reviewed - No data to display Imaging Review No results found. Results for orders placed during the hospital encounter of 03/22/13  CBC WITH DIFFERENTIAL      Result Value Range   WBC 8.2  4.0 - 10.5 K/uL   RBC 4.50  3.87 - 5.11 MIL/uL   Hemoglobin 14.5  12.0 - 15.0 g/dL   HCT 40.9  81.1 - 91.4 %   MCV 90.9  78.0 - 100.0 fL   MCH 32.2  26.0 - 34.0 pg   MCHC 35.5  30.0 - 36.0 g/dL   RDW 78.2  95.6 - 21.3 %   Platelets 267  150 - 400 K/uL   Neutrophils Relative %  78 (*) 43 - 77 %   Neutro Abs 6.4  1.7 - 7.7 K/uL   Lymphocytes Relative 15  12 - 46 %   Lymphs Abs 1.2  0.7 - 4.0 K/uL   Monocytes Relative 6  3 - 12 %  Monocytes Absolute 0.5  0.1 - 1.0 K/uL   Eosinophils Relative 0  0 - 5 %   Eosinophils Absolute 0.0  0.0 - 0.7 K/uL   Basophils Relative 0  0 - 1 %   Basophils Absolute 0.0  0.0 - 0.1 K/uL  BASIC METABOLIC PANEL      Result Value Range   Sodium 134 (*) 135 - 145 mEq/L   Potassium 3.5  3.5 - 5.1 mEq/L   Chloride 99  96 - 112 mEq/L   CO2 21  19 - 32 mEq/L   Glucose, Bld 80  70 - 99 mg/dL   BUN 7  6 - 23 mg/dL   Creatinine, Ser 6.96  0.50 - 1.10 mg/dL   Calcium 9.9  8.4 - 29.5 mg/dL   GFR calc non Af Amer >90  >90 mL/min   GFR calc Af Amer >90  >90 mL/min    EKG Interpretation   None       MDM  No diagnosis found. 1. Healing abscess 2. General malaise  Lab studies WNL - no evidence dehydration. VSS. No further intervention required on abscess. Encouraged to complete antibiotic regimens and follow up with PCP for recheck this week.    I personally performed the services described in this documentation, which was scribed in my presence. The recorded information has been reviewed and is accurate.       Arnoldo Hooker, PA-C 03/22/13 1547

## 2013-03-22 NOTE — Discharge Instructions (Signed)
FOLLOW UP WITH DR. Milinda Antis FOR RECHECK IF NO BETTER IN 2-3 DAYS. CONTINUE CURRENT MEDICATIONS. RETURN HERE AS NEEDED.

## 2013-03-22 NOTE — ED Notes (Signed)
1 cm diameter open, draining, abscess on coccyx.  PT Stated:  Seen by PCP yesterday. Started on antibiotics. Culture sent.  Pt opened wound 3 days ago, drainage clear, blood tinged

## 2013-03-24 LAB — WOUND CULTURE: Gram Stain: NONE SEEN

## 2013-03-26 NOTE — ED Provider Notes (Signed)
Medical screening examination/treatment/procedure(s) were performed by non-physician practitioner and as supervising physician I was immediately available for consultation/collaboration.  Kimanh Templeman, MD 03/26/13 1548 

## 2013-04-14 ENCOUNTER — Other Ambulatory Visit: Payer: Self-pay

## 2013-06-13 ENCOUNTER — Other Ambulatory Visit: Payer: Self-pay | Admitting: Family Medicine

## 2013-06-16 ENCOUNTER — Other Ambulatory Visit: Payer: Self-pay

## 2013-06-16 MED ORDER — HYDROCHLOROTHIAZIDE 25 MG PO TABS: ORAL_TABLET | ORAL | Status: DC

## 2013-06-16 NOTE — Telephone Encounter (Signed)
Pt waiting on HCTZ to come from mail order pharmacy; pt out of med for 2 days. Refilled # [facility]. Pt notified done.

## 2013-06-20 ENCOUNTER — Other Ambulatory Visit: Payer: Self-pay | Admitting: *Deleted

## 2013-06-20 DIAGNOSIS — B20 Human immunodeficiency virus [HIV] disease: Secondary | ICD-10-CM

## 2013-06-20 MED ORDER — EMTRICITAB-RILPIVIR-TENOFOV DF 200-25-300 MG PO TABS: 1.0000 | ORAL_TABLET | Freq: Every evening | ORAL | Status: DC

## 2013-07-06 ENCOUNTER — Ambulatory Visit (INDEPENDENT_AMBULATORY_CARE_PROVIDER_SITE_OTHER): Payer: BC Managed Care – PPO | Admitting: Family Medicine

## 2013-07-06 ENCOUNTER — Encounter: Payer: Self-pay | Admitting: Family Medicine

## 2013-07-06 VITALS — BP 102/68 | HR 67 | Temp 97.8°F | Ht 67.25 in | Wt 204.0 lb

## 2013-07-06 DIAGNOSIS — R21 Rash and other nonspecific skin eruption: Secondary | ICD-10-CM | POA: Insufficient documentation

## 2013-07-06 MED ORDER — CLOBETASOL PROPIONATE 0.05 % EX CREA: 1.0000 "application " | TOPICAL_CREAM | Freq: Two times a day (BID) | CUTANEOUS | Status: DC

## 2013-07-06 NOTE — Progress Notes (Signed)
Pre-visit discussion using our clinic review tool. No additional management support is needed unless otherwise documented below in the visit note.  

## 2013-07-06 NOTE — Patient Instructions (Signed)
Stay away from harsh detergents and hot water Benadryl for itching is ok as needed  Try the temovate cream as needed  If worse or no improvement let me know

## 2013-07-06 NOTE — Progress Notes (Signed)
Subjective:    Patient ID: Carla Thomas, female    DOB: 23-Mar-1966, 48 y.o.   MRN: [id number]  HPI Here with a rash   Little red spots for the past 2 weeks  Also diffuse areas of redness that go away quickly   (L arm)  They do itch  Back of neck  Wrist  Back of knees  Abdomen  Not on scalp   Took benadryl once when really itchy- just one night and it helped    Uses olay facial wash Cocoa butter soap- used for a long time  No new foods/ products or meds   Is on the emtricatab/rilpivir-for a while   Not on levaquin or bactrim    Is working out  Not smoking  Dry skin also  Hx eczema   Some stress - has a 32 y old daughter that gives her some stress  No new aches and pains - but occ muscle cramps and legs - wonders if K is low    Chemistry      Component Value Date/Time   NA 134* 03/22/2013 1500   K 3.5 03/22/2013 1500   CL 99 03/22/2013 1500   CO2 21 03/22/2013 1500   BUN 7 03/22/2013 1500   CREATININE 0.75 03/22/2013 1500   CREATININE 0.76 05/12/2012 0921      Component Value Date/Time   CALCIUM 9.9 03/22/2013 1500   ALKPHOS 49 05/12/2012 0921   AST 18 05/12/2012 0921   ALT 15 05/12/2012 0921   BILITOT 0.4 05/12/2012 0921      ? Perhaps a little trouble concentrating    Sees Dr Linus Salmons once a year in June for HIV  Patient Active Problem List   Diagnosis Date Noted  . Abscess 03/21/2013  . Acute bacterial sinusitis 04/23/2012  . Former smoker 06/02/2011  . Lump in neck 02/26/2011  . HIV disease 01/05/2011  . Exposure to STD 12/16/2010  . Other screening mammogram 11/12/2010  . Hypokalemia 11/07/2010  . Leukopenia 11/07/2010  . Pancreatitis 11/07/2010  . Routine general medical examination at a health care facility 11/04/2010  . HYPERCHOLESTEROLEMIA 06/12/2010  . DYSPEPSIA 07/18/2009  . PALPITATIONS 07/18/2009  . FIBROIDS, UTERUS 10/16/2006  . GOITER 10/16/2006  . [place] DISEASE 10/16/2006  . GOUT 10/16/2006  . ANEMIA-IRON DEFICIENCY  10/16/2006  . ANXIETY 10/16/2006  . HYPERTENSION 10/16/2006  . RHINITIS, ALLERGIC NEC 10/16/2006  . ASTHMA 10/16/2006  . ECZEMA 10/16/2006  . PLANTAR FASCIITIS 10/16/2006   Past Medical History  Diagnosis Date  . Anemia     iron deficiency  . Anxiety   . Asthma   . Gout   . Hypertension   . HIV (human immunodeficiency virus infection)   . Potassium (K) deficiency   . Dehydration    Past Surgical History  Procedure Laterality Date  . Cesarean section    . Abdominal hysterectomy  04/2006    partial for fibroids   History  Substance Use Topics  . Smoking status: Former Smoker    Types: Cigarettes  . Smokeless tobacco: Never Used     Comment: stop smoking in Sept  . Alcohol Use: Yes   Family History  Problem Relation Age of Onset  . Cancer Brother     lung CA  . Cancer Maternal Grandmother     breast CA  . Heart failure Maternal Grandmother    Allergies  Allergen Reactions  . Codeine     REACTION: headaches  . Shellfish Allergy  Irritates gout  . Varenicline Tartrate   . Vitamin E     Hair loss  . Amoxicillin Rash   Current Outpatient Prescriptions on File Prior to Visit  Medication Sig Dispense Refill  . acetaminophen (TYLENOL) 500 MG tablet Take 1,000 mg by mouth every 6 (six) hours as needed for pain.      Marland Kitchen albuterol (PROAIR HFA) 108 (90 BASE) MCG/ACT inhaler Inhale 2 puffs into the lungs 4 (four) times daily.  1 Inhaler  11  . calcium carbonate (OS-CAL - DOSED IN MG OF ELEMENTAL CALCIUM) 1250 MG tablet Take 1 tablet by mouth 2 (two) times daily.       . cholecalciferol (VITAMIN D) 400 UNITS TABS Take 400 Units by mouth daily.        . Emtricitab-Rilpivir-Tenofovir 200-25-300 MG TABS Take 1 tablet by mouth every evening.  30 tablet  0  . fluticasone (FLONASE) 50 MCG/ACT nasal spray Place 2 sprays into the nose as needed for rhinitis.      . hydrochlorothiazide (HYDRODIURIL) 25 MG tablet TAKE 1 TABLET EVERY MORNING  15 tablet  0  . levofloxacin  (LEVAQUIN) 500 MG tablet Take 1 tablet (500 mg total) by mouth daily.  10 tablet  0  . sulfamethoxazole-trimethoprim (BACTRIM DS) 800-160 MG per tablet Take 1 tablet by mouth 2 (two) times daily.  20 tablet  0   Current Facility-Administered Medications on File Prior to Visit  Medication Dose Route Frequency Provider Last Rate Last Dose  . pneumococcal 23 valent vaccine (PNU-IMMUNE) injection 0.5 mL  0.5 mL Intramuscular Once Truman Hayward, MD          Review of Systems Review of Systems  Constitutional: Negative for fever, appetite change, fatigue and unexpected weight change.  Eyes: Negative for pain and visual disturbance.  Respiratory: Negative for cough and shortness of breath.   Cardiovascular: Negative for cp or palpitations    Gastrointestinal: Negative for nausea, diarrhea and constipation.  Genitourinary: Negative for urgency and frequency.  Skin: Negative for pallor and pos for itch/ rash Neurological: Negative for weakness, light-headedness, numbness and headaches.  Hematological: Negative for adenopathy. Does not bruise/bleed easily.  Psychiatric/Behavioral: Negative for dysphoric mood. The patient is not nervous/anxious.         Objective:   Physical Exam  Constitutional: She appears well-developed and well-nourished. No distress.  HENT:  Head: Normocephalic and atraumatic.  Mouth/Throat: Oropharynx is clear and moist.  Eyes: Conjunctivae and EOM are normal. Pupils are equal, round, and reactive to light. Right eye exhibits no discharge. Left eye exhibits no discharge. No scleral icterus.  Neck: Normal range of motion. Neck supple.  Cardiovascular: Normal rate and regular rhythm.   Pulmonary/Chest: Effort normal and breath sounds normal. She has no wheezes.  Lymphadenopathy:    She has no cervical adenopathy.  Neurological: She is alert. She has normal reflexes.  Skin: Skin is warm and dry. Rash noted. There is erythema.  Areas of rash at base of scalp and  arms with some pink papules , and a more hive like erythema / induration on L upper arm   No skin breakdown or vesicles   Psychiatric: She has a normal mood and affect.          Assessment & Plan:

## 2013-07-07 NOTE — Assessment & Plan Note (Signed)
Unsure of etiology Hx of eczema in the past and generally sensitive skin (demonstrates dermographia) Will try temovate cream prn to exp areas See avs -further inst  wil pay attn to exposures and meds as well Update if not starting to improve in a week or if worsening

## 2013-07-24 ENCOUNTER — Telehealth: Payer: Self-pay | Admitting: Family Medicine

## 2013-07-24 DIAGNOSIS — M109 Gout, unspecified: Secondary | ICD-10-CM

## 2013-07-24 DIAGNOSIS — I10 Essential (primary) hypertension: Secondary | ICD-10-CM

## 2013-07-24 DIAGNOSIS — Z0000 Encounter for general adult medical examination without abnormal findings: Secondary | ICD-10-CM

## 2013-07-24 NOTE — Telephone Encounter (Signed)
Message copied by Abner Greenspan on Sun Jul 24, 2013  1:23 PM ------      Message from: Ellamae Sia      Created: Thu Jul 21, 2013 11:46 AM      Regarding: lab orders for Monday, 2.16.15       Patient is scheduled for CPX labs, please order future labs, Thanks , Terri       ------

## 2013-07-25 ENCOUNTER — Telehealth: Payer: Self-pay | Admitting: Radiology

## 2013-07-25 ENCOUNTER — Other Ambulatory Visit (INDEPENDENT_AMBULATORY_CARE_PROVIDER_SITE_OTHER): Payer: BC Managed Care – PPO

## 2013-07-25 DIAGNOSIS — I10 Essential (primary) hypertension: Secondary | ICD-10-CM

## 2013-07-25 DIAGNOSIS — E7800 Pure hypercholesterolemia, unspecified: Secondary | ICD-10-CM

## 2013-07-25 DIAGNOSIS — D72819 Decreased white blood cell count, unspecified: Secondary | ICD-10-CM

## 2013-07-25 DIAGNOSIS — D509 Iron deficiency anemia, unspecified: Secondary | ICD-10-CM

## 2013-07-25 DIAGNOSIS — E876 Hypokalemia: Secondary | ICD-10-CM

## 2013-07-25 DIAGNOSIS — Z202 Contact with and (suspected) exposure to infections with a predominantly sexual mode of transmission: Secondary | ICD-10-CM

## 2013-07-25 DIAGNOSIS — Z0000 Encounter for general adult medical examination without abnormal findings: Secondary | ICD-10-CM

## 2013-07-25 LAB — CBC WITH DIFFERENTIAL/PLATELET
Basophils Absolute: 0 10*3/uL (ref 0.0–0.1)
Basophils Relative: 0.4 % (ref 0.0–3.0)
EOS ABS: 0.2 10*3/uL (ref 0.0–0.7)
EOS PCT: 2.6 % (ref 0.0–5.0)
HEMATOCRIT: 40.7 % (ref 36.0–46.0)
Hemoglobin: 13.1 g/dL (ref 12.0–15.0)
LYMPHS ABS: 1.5 10*3/uL (ref 0.7–4.0)
Lymphocytes Relative: 25.3 % (ref 12.0–46.0)
MCHC: 32.3 g/dL (ref 30.0–36.0)
MCV: 97.8 fl (ref 78.0–100.0)
Monocytes Absolute: 0.4 10*3/uL (ref 0.1–1.0)
Monocytes Relative: 7.3 % (ref 3.0–12.0)
Neutro Abs: 3.9 10*3/uL (ref 1.4–7.7)
Neutrophils Relative %: 64.4 % (ref 43.0–77.0)
PLATELETS: 232 10*3/uL (ref 150.0–400.0)
RBC: 4.16 Mil/uL (ref 3.87–5.11)
RDW: 13.6 % (ref 11.5–14.6)
WBC: 6.1 10*3/uL (ref 4.5–10.5)

## 2013-07-25 LAB — LIPID PANEL
CHOLESTEROL: 145 mg/dL (ref 0–200)
HDL: 45.4 mg/dL (ref >39.00)
LDL CALC: 93 mg/dL (ref 0–99)
TRIGLYCERIDES: 34 mg/dL (ref 0.0–149.0)
Total CHOL/HDL Ratio: 3
VLDL: 6.8 mg/dL (ref 0.0–40.0)

## 2013-07-25 LAB — COMPREHENSIVE METABOLIC PANEL
ALK PHOS: 51 U/L (ref 39–117)
ALT: 17 U/L (ref 0–35)
AST: 19 U/L (ref 0–37)
Albumin: 4 g/dL (ref 3.5–5.2)
BUN: 12 mg/dL (ref 6–23)
CO2: 30 mEq/L (ref 19–32)
Calcium: 9.1 mg/dL (ref 8.4–10.5)
Chloride: 101 mEq/L (ref 96–112)
Creatinine, Ser: 0.7 mg/dL (ref 0.4–1.2)
GFR: 107.96 mL/min (ref >60.00)
GLUCOSE: 86 mg/dL (ref 70–99)
Potassium: 4.1 mEq/L (ref 3.5–5.1)
Sodium: 138 mEq/L (ref 135–145)
Total Bilirubin: 0.6 mg/dL (ref 0.3–1.2)
Total Protein: 7.3 g/dL (ref 6.0–8.3)

## 2013-07-25 LAB — TSH: TSH: 2.92 u[IU]/mL (ref 0.35–5.50)

## 2013-07-25 NOTE — Telephone Encounter (Signed)
Patient had a needle stick at work, they did HIV and Hep panel. She wants to do Herpes, RPR in addition to the labs you ordered today. I drew extra blood

## 2013-07-25 NOTE — Addendum Note (Signed)
Addended by: Ellamae Sia on: 07/25/2013 10:59 AM   Modules accepted: Orders

## 2013-07-25 NOTE — Telephone Encounter (Signed)
Please add those on - I guess dx would be Exposure to communicable dz  thanks

## 2013-07-26 LAB — HSV(HERPES SMPLX)ABS-I+II(IGG+IGM)-BLD
HSV 1 Glycoprotein G Ab, IgG: 15.84 IV — ABNORMAL HIGH
HSV 2 Glycoprotein G Ab, IgG: 14.11 IV — ABNORMAL HIGH
Herpes Simplex Vrs I&II-IgM Ab (EIA): 0.44 INDEX

## 2013-07-26 LAB — RPR: RPR Ser Ql: REACTIVE — AB

## 2013-07-26 LAB — RPR TITER: RPR Titer: 1:1 {titer}

## 2013-07-27 LAB — T.PALLIDUM AB, TOTAL

## 2013-08-01 ENCOUNTER — Other Ambulatory Visit (HOSPITAL_COMMUNITY)
Admission: RE | Admit: 2013-08-01 | Discharge: 2013-08-01 | Disposition: A | Discharge status: Home / Self Care | Payer: BC Managed Care – PPO | Source: Ambulatory Visit | Attending: Family Medicine | Admitting: Family Medicine

## 2013-08-01 ENCOUNTER — Ambulatory Visit (INDEPENDENT_AMBULATORY_CARE_PROVIDER_SITE_OTHER): Payer: BC Managed Care – PPO | Admitting: Family Medicine

## 2013-08-01 ENCOUNTER — Encounter: Payer: Self-pay | Admitting: Family Medicine

## 2013-08-01 VITALS — BP 112/74 | HR 65 | Temp 98.9°F | Ht 67.0 in | Wt 209.2 lb

## 2013-08-01 DIAGNOSIS — Z01419 Encounter for gynecological examination (general) (routine) without abnormal findings: Secondary | ICD-10-CM

## 2013-08-01 DIAGNOSIS — E7800 Pure hypercholesterolemia, unspecified: Secondary | ICD-10-CM

## 2013-08-01 DIAGNOSIS — Z1151 Encounter for screening for human papillomavirus (HPV): Secondary | ICD-10-CM | POA: Insufficient documentation

## 2013-08-01 DIAGNOSIS — Z0000 Encounter for general adult medical examination without abnormal findings: Secondary | ICD-10-CM

## 2013-08-01 DIAGNOSIS — I10 Essential (primary) hypertension: Secondary | ICD-10-CM

## 2013-08-01 NOTE — Assessment & Plan Note (Signed)
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

## 2013-08-01 NOTE — Patient Instructions (Addendum)
Don't forget to schedule your annual mammogram at the breast center  Pap today  For weight loss- cut portions by 1/4 and keep exercising Take care of yourself

## 2013-08-01 NOTE — Assessment & Plan Note (Signed)
BP: 112/74 mmHg  bp in fair control at this time  No changes needed Disc lifstyle change with low sodium diet and exercise   Labs reviewed

## 2013-08-01 NOTE — Assessment & Plan Note (Signed)
Disc goals for lipids and reasons to control them Rev labs with pt Rev low sat fat diet in detail   

## 2013-08-01 NOTE — Progress Notes (Signed)
Subjective:    Patient ID: Carla Thomas, female    DOB: 10-24-1965, 48 y.o.   MRN: [id number]  HPI Here for health maintenance exam and to review chronic medical problems   Doing well overall   Wt is up 5 lb with bmi of 32 She is working out 4 days per week - elliptical/ cardio (30 min) and also rowing machine , and also some wts  She eats too much and too often    Pap 1/08- needs a pap  Gyn care- does not see a gyn right now / not had one since partial hyst in 2006 - had a hysterectomy for fiboids  Mammogram 12/12- overdue and will make her own appt  Self exam - no lumps   Flu shot - in the fall   Pneumonia vaccine -- about 3 years ago   Td 6/06  bp is stable today  No cp or palpitations or headaches or edema  No side effects to medicines  BP Readings from Last 3 Encounters:  08/01/13 112/74  07/06/13 102/68  03/22/13 119/78       Labs   Lab Results  Component Value Date   CHOL 145 07/25/2013   CHOL 139 06/08/2012   CHOL 149 06/02/2011   Lab Results  Component Value Date   HDL 45.40 07/25/2013   HDL 38.00* 06/08/2012   HDL 41.80 06/02/2011   Lab Results  Component Value Date   LDLCALC 93 07/25/2013   LDLCALC 89 06/08/2012   LDLCALC 99 06/02/2011   Lab Results  Component Value Date   TRIG 34.0 07/25/2013   TRIG 60.0 06/08/2012   TRIG 41.0 06/02/2011   Lab Results  Component Value Date   CHOLHDL 3 07/25/2013   CHOLHDL 4 06/08/2012   CHOLHDL 4 06/02/2011   Lab Results  Component Value Date   LDLDIRECT 154.6 12/16/2010   very good / to goal  HDL is up   Lab Results  Component Value Date   WBC 6.1 07/25/2013   HGB 13.1 07/25/2013   HCT 40.7 07/25/2013   MCV 97.8 07/25/2013   PLT 232.0 07/25/2013      Chemistry      Component Value Date/Time   NA 138 07/25/2013 0904   K 4.1 07/25/2013 0904   CL 101 07/25/2013 0904   CO2 30 07/25/2013 0904   BUN 12 07/25/2013 0904   CREATININE 0.7 07/25/2013 0904   CREATININE 0.76 05/12/2012 0921        Component Value Date/Time   CALCIUM 9.1 07/25/2013 0904   ALKPHOS 51 07/25/2013 0904   AST 19 07/25/2013 0904   ALT 17 07/25/2013 0904   BILITOT 0.6 07/25/2013 0904    Glucose nl at 21  Lab Results  Component Value Date   TSH 2.92 07/25/2013   hx of goiter /graves' dz  No change in goiter/neck   Had 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  Eyes: Negative for pain and visual disturbance.  Respiratory: Negative for cough and shortness of breath.   Cardiovascular: Negative for cp or  palpitations    Gastrointestinal: Negative for nausea, diarrhea and constipation.  Genitourinary: Negative for urgency and frequency.  Skin: Negative for pallor or rash   Neurological: Negative for weakness, light-headedness, numbness and headaches.  Hematological: Negative for adenopathy. Does not bruise/bleed easily.  Psychiatric/Behavioral: Negative for dysphoric mood. The patient is not nervous/anxious.         Objective:   Physical Exam  Constitutional: She appears well-developed and well-nourished. No distress.  obese and well appearing   HENT:  Head: Normocephalic and atraumatic.  Right Ear: External ear normal.  Left Ear: External ear normal.  Mouth/Throat: Oropharynx is clear and moist.  Eyes: Conjunctivae and EOM are normal. Pupils are equal, round, and reactive to light. No scleral icterus.  Neck: Normal range of motion. Neck supple. No JVD present. Carotid bruit is not present. No thyromegaly present.  Cardiovascular: Normal rate, regular rhythm, normal heart sounds and intact distal pulses.  Exam reveals no gallop.   Pulmonary/Chest:  Effort normal and breath sounds normal. No respiratory distress. She has no wheezes. She exhibits no tenderness.  Abdominal: Soft. Bowel sounds are normal. She exhibits no distension, no abdominal bruit and no mass. There is no tenderness.  Genitourinary: Rectum normal and vagina normal. No breast swelling, tenderness, discharge or bleeding. There is no rash, tenderness or lesion on the right labia. There is no rash, tenderness or lesion on the left labia. Right adnexum displays no mass, no tenderness and no fullness. Left adnexum displays no mass, no tenderness and no fullness. No bleeding around the vagina. No vaginal discharge found.  Cervix/uterus surgically absent  Pap obt from cervical cuff   Breast exam: No mass, nodules, thickening, tenderness, bulging, retraction, inflamation, nipple discharge or skin changes noted.  No axillary or clavicular LA.  Chaperoned exam.    Musculoskeletal: Normal range of motion. She exhibits no edema and no tenderness.  Lymphadenopathy:    She has no cervical adenopathy.  Neurological: She is alert. She has normal reflexes. No cranial nerve deficit. She exhibits normal muscle tone. Coordination normal.  Skin: Skin is warm and dry. No rash noted. No erythema. No pallor.  Psychiatric: She has a normal mood and affect.          Assessment & Plan:

## 2013-08-01 NOTE — Assessment & Plan Note (Signed)
Reviewed health habits including diet and exercise and skin cancer prevention Reviewed appropriate screening tests for age  Also reviewed health mt list, fam hx and immunization status , as well as social and family history    Labs reviewed  Disc plan for wt loss

## 2013-08-01 NOTE — Progress Notes (Signed)
Pre visit review using our clinic review tool, if applicable. No additional management support is needed unless otherwise documented below in the visit note. 

## 2013-08-05 ENCOUNTER — Telehealth: Payer: Self-pay | Admitting: Family Medicine

## 2013-08-05 MED ORDER — METRONIDAZOLE 500 MG PO TABS: 500.0000 mg | ORAL_TABLET | Freq: Two times a day (BID) | ORAL | Status: DC

## 2013-08-05 NOTE — Telephone Encounter (Signed)
Sending in flagyl for trichomonas

## 2013-09-06 ENCOUNTER — Other Ambulatory Visit: Payer: Self-pay

## 2013-09-06 DIAGNOSIS — Z1231 Encounter for screening mammogram for malignant neoplasm of breast: Secondary | ICD-10-CM

## 2013-09-14 ENCOUNTER — Telehealth: Payer: Self-pay | Admitting: *Deleted

## 2013-09-14 DIAGNOSIS — B20 Human immunodeficiency virus [HIV] disease: Secondary | ICD-10-CM

## 2013-09-14 MED ORDER — EMTRICITAB-RILPIVIR-TENOFOV DF 200-25-300 MG PO TABS: 1.0000 | ORAL_TABLET | Freq: Every evening | ORAL | Status: DC

## 2013-09-14 NOTE — Telephone Encounter (Signed)
Made pt f/u lab and MD appts for June, 2015.

## 2013-09-20 ENCOUNTER — Ambulatory Visit
Admission: RE | Admit: 2013-09-20 | Discharge: 2013-09-20 | Disposition: A | Discharge status: Home / Self Care | Payer: BC Managed Care – PPO | Source: Ambulatory Visit

## 2013-09-20 DIAGNOSIS — Z1231 Encounter for screening mammogram for malignant neoplasm of breast: Secondary | ICD-10-CM

## 2013-09-23 ENCOUNTER — Other Ambulatory Visit: Payer: Self-pay | Admitting: Family Medicine

## 2013-09-23 DIAGNOSIS — R928 Other abnormal and inconclusive findings on diagnostic imaging of breast: Secondary | ICD-10-CM

## 2013-09-26 ENCOUNTER — Ambulatory Visit: Payer: BC Managed Care – PPO

## 2013-10-05 ENCOUNTER — Ambulatory Visit
Admission: RE | Admit: 2013-10-05 | Discharge: 2013-10-05 | Disposition: A | Discharge status: Home / Self Care | Payer: BC Managed Care – PPO | Source: Ambulatory Visit | Attending: Family Medicine | Admitting: Family Medicine

## 2013-10-05 DIAGNOSIS — R928 Other abnormal and inconclusive findings on diagnostic imaging of breast: Secondary | ICD-10-CM

## 2013-10-10 ENCOUNTER — Other Ambulatory Visit: Payer: Self-pay | Admitting: Family Medicine

## 2013-10-14 ENCOUNTER — Other Ambulatory Visit: Payer: Self-pay

## 2013-10-14 MED ORDER — HYDROCHLOROTHIAZIDE 25 MG PO TABS: ORAL_TABLET | ORAL | Status: DC

## 2013-10-14 NOTE — Telephone Encounter (Signed)
Pt left v/m requesting refill for # 10 of HCTZ to [facility]; pt has been out of med 2 days. Pt notified refill done.

## 2013-11-16 ENCOUNTER — Other Ambulatory Visit: Payer: BC Managed Care – PPO

## 2013-11-16 DIAGNOSIS — Z113 Encounter for screening for infections with a predominantly sexual mode of transmission: Secondary | ICD-10-CM

## 2013-11-16 DIAGNOSIS — B20 Human immunodeficiency virus [HIV] disease: Secondary | ICD-10-CM

## 2013-11-16 LAB — COMPLETE METABOLIC PANEL WITH GFR
ALBUMIN: 4.1 g/dL (ref 3.5–5.2)
ALT: 12 U/L (ref 0–35)
AST: 18 U/L (ref 0–37)
Alkaline Phosphatase: 49 U/L (ref 39–117)
BUN: 14 mg/dL (ref 6–23)
CO2: 30 mEq/L (ref 19–32)
Calcium: 9.4 mg/dL (ref 8.4–10.5)
Chloride: 102 mEq/L (ref 96–112)
Creat: 0.79 mg/dL (ref 0.50–1.10)
GFR, Est African American: >89 mL/min
GFR, Est Non African American: 89 mL/min
Glucose, Bld: 72 mg/dL (ref 70–99)
POTASSIUM: 3.9 meq/L (ref 3.5–5.3)
SODIUM: 139 meq/L (ref 135–145)
TOTAL PROTEIN: 7.3 g/dL (ref 6.0–8.3)
Total Bilirubin: 0.5 mg/dL (ref 0.2–1.2)

## 2013-11-16 LAB — CBC WITH DIFFERENTIAL/PLATELET
BASOS ABS: 0 10*3/uL (ref 0.0–0.1)
Basophils Relative: 0 % (ref 0–1)
Eosinophils Absolute: 0.1 10*3/uL (ref 0.0–0.7)
Eosinophils Relative: 3 % (ref 0–5)
HCT: 40.6 % (ref 36.0–46.0)
Hemoglobin: 14.1 g/dL (ref 12.0–15.0)
LYMPHS PCT: 29 % (ref 12–46)
Lymphs Abs: 1.4 10*3/uL (ref 0.7–4.0)
MCH: 31.6 pg (ref 26.0–34.0)
MCHC: 34.7 g/dL (ref 30.0–36.0)
MCV: 91 fL (ref 78.0–100.0)
Monocytes Absolute: 0.4 10*3/uL (ref 0.1–1.0)
Monocytes Relative: 8 % (ref 3–12)
NEUTROS ABS: 2.8 10*3/uL (ref 1.7–7.7)
NEUTROS PCT: 60 % (ref 43–77)
PLATELETS: 287 10*3/uL (ref 150–400)
RBC: 4.46 MIL/uL (ref 3.87–5.11)
RDW: 13.5 % (ref 11.5–15.5)
WBC: 4.7 10*3/uL (ref 4.0–10.5)

## 2013-11-17 LAB — T.PALLIDUM AB, TOTAL: T pallidum Antibodies (TP-PA): >8 S/CO — ABNORMAL HIGH (ref <0.90)

## 2013-11-17 LAB — T-HELPER CELL (CD4) - (RCID CLINIC ONLY)
CD4 % Helper T Cell: 47 % (ref 33–55)
CD4 T CELL ABS: 740 /uL (ref 400–2700)

## 2013-11-17 LAB — RPR TITER: RPR Titer: 1:1 {titer}

## 2013-11-17 LAB — RPR: RPR Ser Ql: REACTIVE — AB

## 2013-11-18 LAB — HIV-1 RNA QUANT-NO REFLEX-BLD: HIV 1 RNA Quant: <20 copies/mL (ref <20)

## 2013-11-30 ENCOUNTER — Encounter: Payer: Self-pay | Admitting: Internal Medicine

## 2013-11-30 ENCOUNTER — Ambulatory Visit (INDEPENDENT_AMBULATORY_CARE_PROVIDER_SITE_OTHER): Payer: BC Managed Care – PPO | Admitting: Internal Medicine

## 2013-11-30 VITALS — BP 164/100 | HR 73 | Temp 98.2°F | Ht 68.0 in | Wt 197.0 lb

## 2013-11-30 DIAGNOSIS — B20 Human immunodeficiency virus [HIV] disease: Secondary | ICD-10-CM

## 2013-11-30 DIAGNOSIS — Z113 Encounter for screening for infections with a predominantly sexual mode of transmission: Secondary | ICD-10-CM | POA: Insufficient documentation

## 2013-11-30 MED ORDER — CLOBETASOL PROPIONATE 0.05 % EX CREA: 1.0000 "application " | TOPICAL_CREAM | Freq: Two times a day (BID) | CUTANEOUS | Status: DC

## 2013-11-30 NOTE — Progress Notes (Signed)
  Subjective:    Patient ID: Carla Thomas, female    DOB: [birth year], 48 y.o.   MRN: [id number]  HPI  She comes in for routine followup. She continues to take Complera and denies any missed doses. She feels well is working daily and works a lot in her garden. She feels very positive and happy with her lab results which show a CD4 count of 740 and an undetectable viral load. She has been undetectable now for several years. No complaints, she gets her lipids managed by her primary doctor and her Pap smear and mammogram as well.     Review of Systems  Constitutional: Negative for fever and fatigue.  HENT: Negative for sore throat and trouble swallowing.   Respiratory: Negative for cough and shortness of breath.   Cardiovascular: Negative for chest pain.  Gastrointestinal: Negative for nausea and diarrhea.  Musculoskeletal: Negative for arthralgias and myalgias.  Skin: Negative for rash.  Neurological: Negative for dizziness, light-headedness and headaches.  Hematological: Negative for adenopathy.  Psychiatric/Behavioral: Negative for dysphoric mood.       Objective:   Physical Exam  Constitutional: She appears well-developed and well-nourished. No distress.  HENT:  Mouth/Throat: No oropharyngeal exudate.  Cardiovascular: Normal rate, regular rhythm and normal heart sounds.   No murmur heard. Pulmonary/Chest: Effort normal and breath sounds normal. No respiratory distress. She has no wheezes.  Lymphadenopathy:    She has no cervical adenopathy.  Skin: Skin is warm and dry. No rash noted.          Assessment & Plan:

## 2013-11-30 NOTE — Assessment & Plan Note (Addendum)
Doing great.  RTC 1 year after labs.  She should have a viral load as well in 6 months (around December-January) from her PCP.  She reports she is Hep A and B immune as is required for her job.  No antibodies checked here.

## 2013-12-29 ENCOUNTER — Other Ambulatory Visit: Payer: Self-pay | Admitting: *Deleted

## 2013-12-29 DIAGNOSIS — B20 Human immunodeficiency virus [HIV] disease: Secondary | ICD-10-CM

## 2013-12-29 MED ORDER — EMTRICITAB-RILPIVIR-TENOFOV DF 200-25-300 MG PO TABS: 1.0000 | ORAL_TABLET | Freq: Every evening | ORAL | Status: DC

## 2014-03-17 ENCOUNTER — Other Ambulatory Visit: Payer: Self-pay | Admitting: Family Medicine

## 2014-03-17 NOTE — Telephone Encounter (Signed)
Only phone # on chart is off so letter mailed requesting pt to schedule CPE so we can refill her med

## 2014-03-17 NOTE — Telephone Encounter (Signed)
Please schedule PE in March and refill until then thanks

## 2014-03-17 NOTE — Telephone Encounter (Signed)
Electronic refill request, no recent/future appt., please advise  

## 2014-03-22 NOTE — Telephone Encounter (Signed)
Pt's phone is off and she never responded to my letter, Rx declined and pharmacy advise she needs f/u appt

## 2014-03-24 ENCOUNTER — Ambulatory Visit (INDEPENDENT_AMBULATORY_CARE_PROVIDER_SITE_OTHER): Payer: BC Managed Care – PPO

## 2014-03-24 ENCOUNTER — Other Ambulatory Visit: Payer: Self-pay

## 2014-03-24 DIAGNOSIS — Z23 Encounter for immunization: Secondary | ICD-10-CM

## 2014-03-24 NOTE — Telephone Encounter (Signed)
Pt left note requesting refill on temovate ointment not cream Please advise.  [facility]

## 2014-03-24 NOTE — Telephone Encounter (Signed)
Please refill times 3 

## 2014-03-27 MED ORDER — CLOBETASOL PROPIONATE 0.05 % EX CREA: 1.0000 "application " | TOPICAL_CREAM | Freq: Two times a day (BID) | CUTANEOUS | Status: DC

## 2014-03-27 NOTE — Telephone Encounter (Signed)
done

## 2014-04-12 ENCOUNTER — Other Ambulatory Visit: Payer: Self-pay | Admitting: *Deleted

## 2014-04-12 MED ORDER — HYDROCHLOROTHIAZIDE 25 MG PO TABS: ORAL_TABLET | ORAL | Status: DC

## 2014-05-16 ENCOUNTER — Encounter: Payer: Self-pay | Admitting: Family Medicine

## 2014-05-16 ENCOUNTER — Ambulatory Visit (INDEPENDENT_AMBULATORY_CARE_PROVIDER_SITE_OTHER): Payer: BC Managed Care – PPO | Admitting: Family Medicine

## 2014-05-16 VITALS — BP 120/70 | HR 74 | Temp 98.3°F | Wt 211.0 lb

## 2014-05-16 DIAGNOSIS — L309 Dermatitis, unspecified: Secondary | ICD-10-CM

## 2014-05-16 MED ORDER — CLOBETASOL PROPIONATE 0.05 % EX CREA: 1.0000 "application " | TOPICAL_CREAM | Freq: Two times a day (BID) | CUTANEOUS | Status: DC

## 2014-05-16 NOTE — Progress Notes (Signed)
Pre visit review using our clinic review tool, if applicable. No additional management support is needed unless otherwise documented below in the visit note. 

## 2014-05-16 NOTE — Patient Instructions (Signed)
I think you have a flare of eczema (not uncommon in the winter)  Use the clobetasol cream up to twice per day  Also switch soap to dove soap for sensitive skin  Avoid very hot water and exposure to chemicals  Use moisturizers without color or fragrance like lubriderm or eucerin  Also use clothing detergents that have the word "free" in them and do not use fabric softener  If not improved in 2 weeks - please let me know

## 2014-05-16 NOTE — Progress Notes (Signed)
Subjective:    Patient ID: Carla Thomas, female    DOB: 29-Nov-1965, 48 y.o.   MRN: [id number]  HPI Here with a re occuring rash on R neck About 3 weeks Tried noxema on it  Tried her daughter's clobetasol oint-no improvement (only tried one application)  Itches and burns   Also feels a pea sized lump underneath it   A bit of rash on R arm  Also the back of neck- similar but not as bad     Patient Active Problem List   Diagnosis Date Noted  . Screening examination for venereal disease 11/30/2013  . Encounter for routine gynecological examination 08/01/2013  . Rash and nonspecific skin eruption 07/06/2013  . Former smoker 06/02/2011  . Lump in neck 02/26/2011  . HIV disease 01/05/2011  . Exposure to STD 12/16/2010  . Other screening mammogram 11/12/2010  . Hypokalemia 11/07/2010  . Routine general medical examination at a health care facility 11/04/2010  . HYPERCHOLESTEROLEMIA 06/12/2010  . DYSPEPSIA 07/18/2009  . GOITER 10/16/2006  . [place] DISEASE 10/16/2006  . GOUT 10/16/2006  . ANXIETY 10/16/2006  . HYPERTENSION 10/16/2006  . RHINITIS, ALLERGIC NEC 10/16/2006  . ASTHMA 10/16/2006  . ECZEMA 10/16/2006  . PLANTAR FASCIITIS 10/16/2006   Past Medical History  Diagnosis Date  . Anemia     iron deficiency  . Anxiety   . Asthma   . Gout   . Hypertension   . HIV (human immunodeficiency virus infection)   . Potassium (K) deficiency   . Dehydration    Past Surgical History  Procedure Laterality Date  . Cesarean section    . Abdominal hysterectomy  04/2006    partial for fibroids   History  Substance Use Topics  . Smoking status: Former Smoker    Types: Cigarettes  . Smokeless tobacco: Never Used     Comment: stop smoking in Sept  . Alcohol Use: Yes     Comment: occ   Family History  Problem Relation Age of Onset  . Cancer Brother     lung CA  . Cancer Maternal Grandmother     breast CA  . Heart failure Maternal Grandmother    Allergies    Allergen Reactions  . Codeine     REACTION: headaches  . Shellfish Allergy     Irritates gout  . Varenicline Tartrate   . Vitamin E     Hair loss  . Amoxicillin Rash   Current Outpatient Prescriptions on File Prior to Visit  Medication Sig Dispense Refill  . acetaminophen (TYLENOL) 500 MG tablet Take 1,000 mg by mouth every 6 (six) hours as needed for pain.    Marland Kitchen albuterol (PROAIR HFA) 108 (90 BASE) MCG/ACT inhaler Inhale 2 puffs into the lungs 4 (four) times daily. 1 Inhaler 11  . calcium carbonate (OS-CAL - DOSED IN MG OF ELEMENTAL CALCIUM) 1250 MG tablet Take 1 tablet by mouth 2 (two) times daily.     . cholecalciferol (VITAMIN D) 400 UNITS TABS Take 400 Units by mouth daily.      . clobetasol cream (TEMOVATE) 4.66 % Apply 1 application topically 2 (two) times daily. Apply a small amount of cream to affected areas twice daily as needed 30 g 2  . Emtricitab-Rilpivir-Tenofovir 200-25-300 MG TABS Take 1 tablet by mouth every evening. 90 tablet 2  . fluticasone (FLONASE) 50 MCG/ACT nasal spray Place 2 sprays into the nose as needed for rhinitis.    . hydrochlorothiazide (HYDRODIURIL) 25 MG tablet TAKE  1 TABLET EVERY MORNING 90 tablet 3  . phenylephrine (NEO-SYNEPHRINE) 1 % nasal spray Place 1 drop into both nostrils every 6 (six) hours as needed for congestion.     No current facility-administered medications on file prior to visit.      Review of Systems Review of Systems  Constitutional: Negative for fever, appetite change, fatigue and unexpected weight change.  Eyes: Negative for pain and visual disturbance.  Respiratory: Negative for cough and shortness of breath.   Cardiovascular: Negative for cp or palpitations    Gastrointestinal: Negative for nausea, diarrhea and constipation.  Genitourinary: Negative for urgency and frequency.  Skin: Negative for pallor and pos for mildly itchy rash Neurological: Negative for weakness, light-headedness, numbness and headaches.   Hematological: Negative for adenopathy. Does not bruise/bleed easily.  Psychiatric/Behavioral: Negative for dysphoric mood. The patient is not nervous/anxious.         Objective:   Physical Exam  Constitutional: She appears well-developed and well-nourished. No distress.  overwt and well app  HENT:  Head: Normocephalic and atraumatic.  Eyes: Conjunctivae and EOM are normal. Pupils are equal, round, and reactive to light.  Neck: Normal range of motion. Neck supple.  Cardiovascular: Normal rate and regular rhythm.   Pulmonary/Chest: Effort normal and breath sounds normal. She has no wheezes.  Lymphadenopathy:    She has no cervical adenopathy.  Neurological: She is alert.  Skin: Skin is warm and dry. Rash noted. No pallor.  Dry rash with scale and mild hyperpigmentation on neck and forearms and upper back   Psychiatric: She has a normal mood and affect.          Assessment & Plan:   Problem List Items Addressed This Visit      Musculoskeletal and Integument   Eczema - Primary    Ongoing and worse in the winter  Px temovate cream that has helped in the past  Disc lifestyle change for eczema incl use of products w/o color or fragrance and also avoidance of hot water Update if not starting to improve in a week or if worsening

## 2014-05-21 NOTE — Assessment & Plan Note (Signed)
Ongoing and worse in the winter  Px temovate cream that has helped in the past  Disc lifestyle change for eczema incl use of products w/o color or fragrance and also avoidance of hot water Update if not starting to improve in a week or if worsening

## 2014-08-07 ENCOUNTER — Encounter: Payer: Self-pay | Admitting: Family Medicine

## 2014-08-07 ENCOUNTER — Ambulatory Visit (INDEPENDENT_AMBULATORY_CARE_PROVIDER_SITE_OTHER): Payer: BLUE CROSS/BLUE SHIELD | Admitting: Family Medicine

## 2014-08-07 VITALS — BP 120/84 | HR 68 | Temp 97.9°F | Ht 68.0 in | Wt 208.0 lb

## 2014-08-07 DIAGNOSIS — E7800 Pure hypercholesterolemia, unspecified: Secondary | ICD-10-CM

## 2014-08-07 DIAGNOSIS — I10 Essential (primary) hypertension: Secondary | ICD-10-CM

## 2014-08-07 DIAGNOSIS — Z0000 Encounter for general adult medical examination without abnormal findings: Secondary | ICD-10-CM

## 2014-08-07 DIAGNOSIS — Z23 Encounter for immunization: Secondary | ICD-10-CM

## 2014-08-07 LAB — COMPREHENSIVE METABOLIC PANEL
ALK PHOS: 55 U/L (ref 39–117)
ALT: 14 U/L (ref 0–35)
AST: 18 U/L (ref 0–37)
Albumin: 4.2 g/dL (ref 3.5–5.2)
BUN: 11 mg/dL (ref 6–23)
CO2: 30 mEq/L (ref 19–32)
Calcium: 9.4 mg/dL (ref 8.4–10.5)
Chloride: 101 mEq/L (ref 96–112)
Creatinine, Ser: 0.83 mg/dL (ref 0.40–1.20)
GFR: 94.15 mL/min (ref >60.00)
Glucose, Bld: 94 mg/dL (ref 70–99)
POTASSIUM: 3.5 meq/L (ref 3.5–5.1)
Sodium: 138 mEq/L (ref 135–145)
Total Bilirubin: 0.3 mg/dL (ref 0.2–1.2)
Total Protein: 7.7 g/dL (ref 6.0–8.3)

## 2014-08-07 LAB — CBC WITH DIFFERENTIAL/PLATELET
BASOS PCT: 0.4 % (ref 0.0–3.0)
Basophils Absolute: 0 10*3/uL (ref 0.0–0.1)
Eosinophils Absolute: 0.2 10*3/uL (ref 0.0–0.7)
Eosinophils Relative: 4 % (ref 0.0–5.0)
HCT: 38.8 % (ref 36.0–46.0)
HEMOGLOBIN: 13.3 g/dL (ref 12.0–15.0)
LYMPHS ABS: 1.5 10*3/uL (ref 0.7–4.0)
Lymphocytes Relative: 32.4 % (ref 12.0–46.0)
MCHC: 34.3 g/dL (ref 30.0–36.0)
MCV: 92.3 fl (ref 78.0–100.0)
MONO ABS: 0.3 10*3/uL (ref 0.1–1.0)
Monocytes Relative: 6.9 % (ref 3.0–12.0)
NEUTROS ABS: 2.6 10*3/uL (ref 1.4–7.7)
Neutrophils Relative %: 56.3 % (ref 43.0–77.0)
Platelets: 221 10*3/uL (ref 150.0–400.0)
RBC: 4.21 Mil/uL (ref 3.87–5.11)
RDW: 12.8 % (ref 11.5–15.5)
WBC: 4.6 10*3/uL (ref 4.0–10.5)

## 2014-08-07 LAB — LIPID PANEL
CHOL/HDL RATIO: 4
Cholesterol: 152 mg/dL (ref 0–200)
HDL: 36.8 mg/dL — AB (ref >39.00)
LDL CALC: 106 mg/dL — AB (ref 0–99)
NonHDL: 115.2
TRIGLYCERIDES: 44 mg/dL (ref 0.0–149.0)
VLDL: 8.8 mg/dL (ref 0.0–40.0)

## 2014-08-07 LAB — TSH: TSH: 5.82 u[IU]/mL — ABNORMAL HIGH (ref 0.35–4.50)

## 2014-08-07 MED ORDER — HYDROCHLOROTHIAZIDE 25 MG PO TABS: ORAL_TABLET | ORAL | Status: DC

## 2014-08-07 NOTE — Assessment & Plan Note (Signed)
Reviewed health habits including diet and exercise and skin cancer prevention Reviewed appropriate screening tests for age  Also reviewed health mt list, fam hx and immunization status , as well as social and family history   Tdap today Labs ordered for wellness Breast exam done  Urged her to schedule her annual mammogram for April   Commended good self care

## 2014-08-07 NOTE — Assessment & Plan Note (Signed)
bp in fair control at this time  BP Readings from Last 1 Encounters:  08/07/14 120/84   No changes needed Disc lifstyle change with low sodium diet and exercise  Refilled hctz Labs today

## 2014-08-07 NOTE — Assessment & Plan Note (Signed)
Lab today  Rev low sat fat diet and last result  Doing well with lifestyle

## 2014-08-07 NOTE — Progress Notes (Signed)
Pre visit review using our clinic review tool, if applicable. No additional management support is needed unless otherwise documented below in the visit note. 

## 2014-08-07 NOTE — Patient Instructions (Addendum)
Tdap vaccine today  Keep working on healthy habits -you are doing well  For itchy back- try to moisturize and if needed an antihistamine like claritin may help Don't forget to schedule your mammogram

## 2014-08-07 NOTE — Progress Notes (Signed)
Subjective:    Patient ID: Carla Thomas, female    DOB: 1966-02-08, 49 y.o.   MRN: [id number]  HPI Here for health maintenance exam and to review chronic medical problems    Has been feeling really well  She taking care of herself   Wt is down 3 lb with bmi of 31 Working out (got to 215 over the holidays) -going to the gym  Still a non smoker- proud of that   [place] 4[facility]- [facility]  Self exam no lumps   Td 6/06  Flu shot 10/15  Pap 2/15 nl (s/p hysterectomy) in pt with HIV  No gyn problems  Does not want to do STD testing today    bp is stable today  No cp or palpitations or headaches or edema  No side effects to medicines  BP Readings from Last 3 Encounters:  08/07/14 120/84  05/16/14 120/70  11/30/13 164/100     Due for labs today for wellness   Hx of hyperlipidemia Lab Results  Component Value Date   CHOL 145 07/25/2013   HDL 45.40 07/25/2013   LDLCALC 93 07/25/2013   LDLDIRECT 154.6 12/16/2010   TRIG 34.0 07/25/2013   CHOLHDL 3 07/25/2013    Diet has been pretty good - eating lean protein -chicken and egg whites , oatmeal, fruit   Patient Active Problem List   Diagnosis Date Noted  . Screening examination for venereal disease 11/30/2013  . Encounter for routine gynecological examination 08/01/2013  . Rash and nonspecific skin eruption 07/06/2013  . Former smoker 06/02/2011  . Lump in neck 02/26/2011  . HIV disease 01/05/2011  . Exposure to STD 12/16/2010  . Other screening mammogram 11/12/2010  . Hypokalemia 11/07/2010  . Routine general medical examination at a health care facility 11/04/2010  . HYPERCHOLESTEROLEMIA 06/12/2010  . DYSPEPSIA 07/18/2009  . GOITER 10/16/2006  . [place] DISEASE 10/16/2006  . GOUT 10/16/2006  . ANXIETY 10/16/2006  . Essential hypertension 10/16/2006  . RHINITIS, ALLERGIC NEC 10/16/2006  . ASTHMA 10/16/2006  . Eczema 10/16/2006  . PLANTAR FASCIITIS 10/16/2006   Past Medical History    Diagnosis Date  . Anemia     iron deficiency  . Anxiety   . Asthma   . Gout   . Hypertension   . HIV (human immunodeficiency virus infection)   . Potassium (K) deficiency   . Dehydration    Past Surgical History  Procedure Laterality Date  . Cesarean section    . Abdominal hysterectomy  04/2006    partial for fibroids   History  Substance Use Topics  . Smoking status: Former Smoker    Types: Cigarettes  . Smokeless tobacco: Never Used     Comment: stop smoking in Sept  . Alcohol Use: Yes     Comment: occ   Family History  Problem Relation Age of Onset  . Cancer Brother     lung CA  . Cancer Maternal Grandmother     breast CA  . Heart failure Maternal Grandmother    Allergies  Allergen Reactions  . Codeine     REACTION: headaches  . Shellfish Allergy     Irritates gout  . Varenicline Tartrate   . Vitamin E     Hair loss  . Amoxicillin Rash   Current Outpatient Prescriptions on File Prior to Visit  Medication Sig Dispense Refill  . acetaminophen (TYLENOL) 500 MG tablet Take 1,000 mg by mouth every 6 (six) hours as needed  for pain.    Marland Kitchen albuterol (PROAIR HFA) 108 (90 BASE) MCG/ACT inhaler Inhale 2 puffs into the lungs 4 (four) times daily. 1 Inhaler 11  . calcium carbonate (OS-CAL - DOSED IN MG OF ELEMENTAL CALCIUM) 1250 MG tablet Take 1 tablet by mouth 2 (two) times daily.     . cholecalciferol (VITAMIN D) 400 UNITS TABS Take 400 Units by mouth daily.      . clobetasol cream (TEMOVATE) 1.93 % Apply 1 application topically 2 (two) times daily. Apply a small amount of cream to affected areas twice daily as needed 30 g 2  . Emtricitab-Rilpivir-Tenofovir 200-25-300 MG TABS Take 1 tablet by mouth every evening. 90 tablet 2  . fluticasone (FLONASE) 50 MCG/ACT nasal spray Place 2 sprays into the nose as needed for rhinitis.    . hydrochlorothiazide (HYDRODIURIL) 25 MG tablet TAKE 1 TABLET EVERY MORNING 90 tablet 3  . phenylephrine (NEO-SYNEPHRINE) 1 % nasal spray Place  1 drop into both nostrils every 6 (six) hours as needed for congestion.     No current facility-administered medications on file prior to visit.      Review of Systems Review of Systems  Constitutional: Negative for fever, appetite change, fatigue and unexpected weight change.  Eyes: Negative for pain and visual disturbance.  Respiratory: Negative for cough and shortness of breath.   Cardiovascular: Negative for cp or palpitations    Gastrointestinal: Negative for nausea, diarrhea and constipation.  Genitourinary: Negative for urgency and frequency.  Skin: Negative for pallor or rash   Neurological: Negative for weakness, light-headedness, numbness and headaches.  Hematological: Negative for adenopathy. Does not bruise/bleed easily.  Psychiatric/Behavioral: Negative for dysphoric mood. The patient is not nervous/anxious.         Objective:   Physical Exam  Constitutional: She appears well-developed and well-nourished. No distress.  obese and well appearing   HENT:  Head: Normocephalic and atraumatic.  Right Ear: External ear normal.  Left Ear: External ear normal.  Mouth/Throat: Oropharynx is clear and moist.  Eyes: Conjunctivae and EOM are normal. Pupils are equal, round, and reactive to light. No scleral icterus.  Neck: Normal range of motion. Neck supple. No JVD present. Carotid bruit is not present. Thyromegaly present.  Cardiovascular: Normal rate, regular rhythm, normal heart sounds and intact distal pulses.  Exam reveals no gallop.   Pulmonary/Chest: Effort normal and breath sounds normal. No respiratory distress. She has no wheezes. She exhibits no tenderness.  Abdominal: Soft. Bowel sounds are normal. She exhibits no distension, no abdominal bruit and no mass. There is no tenderness.  Genitourinary: No breast swelling, tenderness, discharge or bleeding.  Breast exam: No mass, nodules, thickening, tenderness, bulging, retraction, inflamation, nipple discharge or skin  changes noted.  No axillary or clavicular LA.      Musculoskeletal: Normal range of motion. She exhibits no edema or tenderness.  Lymphadenopathy:    She has no cervical adenopathy.  Neurological: She is alert. She has normal reflexes. No cranial nerve deficit. She exhibits normal muscle tone. Coordination normal.  Skin: Skin is warm and dry. No rash noted. No erythema. No pallor.  Psychiatric: She has a normal mood and affect.          Assessment & Plan:   Problem List Items Addressed This Visit      Cardiovascular and Mediastinum   Essential hypertension - Primary    bp in fair control at this time  BP Readings from Last 1 Encounters:  08/07/14 120/84   No  changes needed Disc lifstyle change with low sodium diet and exercise  Refilled [facility] today      Relevant Medications   hydrochlorothiazide tablet     Other   HYPERCHOLESTEROLEMIA    Lab today  Rev low sat fat diet and last result  Doing well with lifestyle      Relevant Medications   hydrochlorothiazide tablet   Routine general medical examination at a health care facility    Reviewed health habits including diet and exercise and skin cancer prevention Reviewed appropriate screening tests for age  Also reviewed health mt list, fam hx and immunization status , as well as social and family history   Tdap today Labs ordered for wellness Breast exam done  Urged her to schedule her annual mammogram for April   Commended good self care       Relevant Orders   CBC with Differential/Platelet (Completed)   Comprehensive metabolic panel (Completed)   TSH (Completed)   Lipid panel (Completed)    Other Visit Diagnoses    Need for diphtheria-tetanus-pertussis (Tdap) vaccine, adult/adolescent        Relevant Orders    Tdap vaccine greater than or equal to 7yo IM (Completed)

## 2014-08-08 ENCOUNTER — Telehealth: Payer: Self-pay | Admitting: Family Medicine

## 2014-08-08 ENCOUNTER — Other Ambulatory Visit (INDEPENDENT_AMBULATORY_CARE_PROVIDER_SITE_OTHER): Payer: BLUE CROSS/BLUE SHIELD

## 2014-08-08 DIAGNOSIS — R946 Abnormal results of thyroid function studies: Secondary | ICD-10-CM

## 2014-08-08 LAB — T4, FREE: FREE T4: 0.86 ng/dL (ref 0.60–1.60)

## 2014-08-08 NOTE — Telephone Encounter (Signed)
emmi mailed  °

## 2014-09-25 ENCOUNTER — Other Ambulatory Visit: Payer: Self-pay

## 2014-09-25 DIAGNOSIS — Z1231 Encounter for screening mammogram for malignant neoplasm of breast: Secondary | ICD-10-CM

## 2014-09-26 ENCOUNTER — Ambulatory Visit
Admission: RE | Admit: 2014-09-26 | Discharge: 2014-09-26 | Disposition: A | Discharge status: Home / Self Care | Payer: BLUE CROSS/BLUE SHIELD | Source: Ambulatory Visit

## 2014-09-26 DIAGNOSIS — Z1231 Encounter for screening mammogram for malignant neoplasm of breast: Secondary | ICD-10-CM

## 2014-10-02 ENCOUNTER — Ambulatory Visit (INDEPENDENT_AMBULATORY_CARE_PROVIDER_SITE_OTHER): Payer: BLUE CROSS/BLUE SHIELD | Admitting: Family Medicine

## 2014-10-02 ENCOUNTER — Encounter: Payer: Self-pay | Admitting: Family Medicine

## 2014-10-02 VITALS — BP 126/82 | HR 66 | Temp 98.5°F | Wt 216.5 lb

## 2014-10-02 DIAGNOSIS — J029 Acute pharyngitis, unspecified: Secondary | ICD-10-CM

## 2014-10-02 LAB — POCT RAPID STREP A (OFFICE): RAPID STREP A SCREEN: NEGATIVE

## 2014-10-02 NOTE — Assessment & Plan Note (Signed)
Nl exam and neg RST Suspect due to post nasal drip or virus that is getting better Will also watch out for GERD symptoms  Disc symptomatic care - see instructions on AVS  Update if not starting to improve in a week or if worsening

## 2014-10-02 NOTE — Progress Notes (Signed)
Pre visit review using our clinic review tool, if applicable. No additional management support is needed unless otherwise documented below in the visit note. 

## 2014-10-02 NOTE — Patient Instructions (Signed)
I think your sore throat may have been been from a virus or post nasal drip from allergies or a bout of acid reflux If it continues- try some zantac over the counter 75-150 mg twice daily  For allergies you could try claritin or other antihistamine   Update if not starting to improve in a week or if worsening

## 2014-10-02 NOTE — Progress Notes (Signed)
Subjective:    Patient ID: Carla Thomas, female    DOB: [birth year], 49 y.o.   MRN: [id number]  HPI Sore throat for at least 1 1/2 weeks  Hurt to swallow  The pain is better now but she feels like there is a lump of phlegm in throat  Used throat spray otc and a ginger mint losenge  Not clearing throat or coughing  No fever  Feels ok otherwise  Appetite is ok    Patient Active Problem List   Diagnosis Date Noted  . Sore throat 10/02/2014  . Screening examination for venereal disease 11/30/2013  . Encounter for routine gynecological examination 08/01/2013  . Rash and nonspecific skin eruption 07/06/2013  . Former smoker 06/02/2011  . Lump in neck 02/26/2011  . HIV disease 01/05/2011  . Exposure to STD 12/16/2010  . Other screening mammogram 11/12/2010  . Hypokalemia 11/07/2010  . Routine general medical examination at a health care facility 11/04/2010  . HYPERCHOLESTEROLEMIA 06/12/2010  . DYSPEPSIA 07/18/2009  . GOITER 10/16/2006  . [place] DISEASE 10/16/2006  . GOUT 10/16/2006  . ANXIETY 10/16/2006  . Essential hypertension 10/16/2006  . RHINITIS, ALLERGIC NEC 10/16/2006  . ASTHMA 10/16/2006  . Eczema 10/16/2006  . PLANTAR FASCIITIS 10/16/2006   Past Medical History  Diagnosis Date  . Anemia     iron deficiency  . Anxiety   . Asthma   . Gout   . Hypertension   . HIV (human immunodeficiency virus infection)   . Potassium (K) deficiency   . Dehydration    Past Surgical History  Procedure Laterality Date  . Cesarean section    . Abdominal hysterectomy  04/2006    partial for fibroids   History  Substance Use Topics  . Smoking status: Former Smoker    Types: Cigarettes  . Smokeless tobacco: Never Used     Comment: stop smoking in Sept  . Alcohol Use: Yes     Comment: occ   Family History  Problem Relation Age of Onset  . Cancer Brother     lung CA  . Cancer Maternal Grandmother     breast CA  . Heart failure Maternal Grandmother     Allergies  Allergen Reactions  . Codeine     REACTION: headaches  . Shellfish Allergy     Irritates gout  . Varenicline Tartrate   . Vitamin E     Hair loss  . Amoxicillin Rash   Current Outpatient Prescriptions on File Prior to Visit  Medication Sig Dispense Refill  . acetaminophen (TYLENOL) 500 MG tablet Take 1,000 mg by mouth every 6 (six) hours as needed for pain.    Marland Kitchen albuterol (PROAIR HFA) 108 (90 BASE) MCG/ACT inhaler Inhale 2 puffs into the lungs 4 (four) times daily. 1 Inhaler 11  . calcium carbonate (OS-CAL - DOSED IN MG OF ELEMENTAL CALCIUM) 1250 MG tablet Take 1 tablet by mouth 2 (two) times daily.     . cholecalciferol (VITAMIN D) 400 UNITS TABS Take 400 Units by mouth daily.      . clobetasol cream (TEMOVATE) 3.33 % Apply 1 application topically 2 (two) times daily. Apply a small amount of cream to affected areas twice daily as needed 30 g 2  . Emtricitab-Rilpivir-Tenofovir 200-25-300 MG TABS Take 1 tablet by mouth every evening. 90 tablet 2  . fluticasone (FLONASE) 50 MCG/ACT nasal spray Place 2 sprays into the nose as needed for rhinitis.    . hydrochlorothiazide (HYDRODIURIL) 25 MG tablet TAKE  1 TABLET EVERY MORNING 90 tablet 3  . phenylephrine (NEO-SYNEPHRINE) 1 % nasal spray Place 1 drop into both nostrils every 6 (six) hours as needed for congestion.     No current facility-administered medications on file prior to visit.    Review of Systems Review of Systems  Constitutional: Negative for fever, appetite change, fatigue and unexpected weight change.  ENT pos for post nasal drip and globus sensation/ pos for ST that is better now  Eyes: Negative for pain and visual disturbance.  Respiratory: Negative for cough and shortness of breath.  neg for wheezing  Cardiovascular: Negative for cp or palpitations    Gastrointestinal: Negative for nausea, diarrhea and constipation. pos for occ heartburn Genitourinary: Negative for urgency and frequency.  Skin: Negative for  pallor or rash   Neurological: Negative for weakness, light-headedness, numbness and headaches.  Hematological: Negative for adenopathy. Does not bruise/bleed easily.  Psychiatric/Behavioral: Negative for dysphoric mood. The patient is not nervous/anxious.         Objective:   Physical Exam  Constitutional: She appears well-developed and well-nourished. No distress.  HENT:  Head: Normocephalic and atraumatic.  Right Ear: External ear normal.  Left Ear: External ear normal.  Nose: Nose normal.  Mouth/Throat: Oropharynx is clear and moist.  Nares are boggy Mild clear rhinorrhea Throat app nl -no erythema or swelling or lesions  Eyes: Conjunctivae and EOM are normal. Pupils are equal, round, and reactive to light. Right eye exhibits no discharge. Left eye exhibits no discharge. No scleral icterus.  Neck: Normal range of motion. Neck supple.  No change in thyroid exam  Cardiovascular: Normal rate and regular rhythm.   Pulmonary/Chest: Effort normal and breath sounds normal.  Musculoskeletal: She exhibits no edema.  Lymphadenopathy:    She has no cervical adenopathy.  Neurological: She is alert. No cranial nerve deficit.  Skin: Skin is warm and dry. No rash noted. No erythema.  Psychiatric: She has a normal mood and affect.          Assessment & Plan:   Problem List Items Addressed This Visit      Other   Sore throat - Primary    Nl exam and neg RST Suspect due to post nasal drip or virus that is getting better Will also watch out for GERD symptoms  Disc symptomatic care - see instructions on AVS  Update if not starting to improve in a week or if worsening        Relevant Orders   POCT rapid strep A

## 2014-10-11 ENCOUNTER — Other Ambulatory Visit: Payer: Self-pay | Admitting: *Deleted

## 2014-10-11 DIAGNOSIS — B20 Human immunodeficiency virus [HIV] disease: Secondary | ICD-10-CM

## 2014-10-11 MED ORDER — EMTRICITAB-RILPIVIR-TENOFOV DF 200-25-300 MG PO TABS: 1.0000 | ORAL_TABLET | Freq: Every evening | ORAL | Status: DC

## 2014-10-11 NOTE — Telephone Encounter (Signed)
Made 1 year appts with Dr. Linus Salmons

## 2014-11-07 ENCOUNTER — Other Ambulatory Visit: Payer: BLUE CROSS/BLUE SHIELD

## 2014-11-09 ENCOUNTER — Other Ambulatory Visit: Payer: BLUE CROSS/BLUE SHIELD

## 2014-11-09 DIAGNOSIS — B20 Human immunodeficiency virus [HIV] disease: Secondary | ICD-10-CM

## 2014-11-09 DIAGNOSIS — Z113 Encounter for screening for infections with a predominantly sexual mode of transmission: Secondary | ICD-10-CM

## 2014-11-09 LAB — COMPLETE METABOLIC PANEL WITH GFR
ALT: 15 U/L (ref 0–35)
AST: 18 U/L (ref 0–37)
Albumin: 4.3 g/dL (ref 3.5–5.2)
Alkaline Phosphatase: 59 U/L (ref 39–117)
BUN: 13 mg/dL (ref 6–23)
CO2: 27 mEq/L (ref 19–32)
Calcium: 9.4 mg/dL (ref 8.4–10.5)
Chloride: 102 mEq/L (ref 96–112)
Creat: 0.66 mg/dL (ref 0.50–1.10)
Glucose, Bld: 87 mg/dL (ref 70–99)
POTASSIUM: 3.9 meq/L (ref 3.5–5.3)
Sodium: 139 mEq/L (ref 135–145)
TOTAL PROTEIN: 7.3 g/dL (ref 6.0–8.3)
Total Bilirubin: 0.3 mg/dL (ref 0.2–1.2)

## 2014-11-09 LAB — CBC WITH DIFFERENTIAL/PLATELET
BASOS ABS: 0 10*3/uL (ref 0.0–0.1)
BASOS PCT: 0 % (ref 0–1)
Eosinophils Absolute: 0.2 10*3/uL (ref 0.0–0.7)
Eosinophils Relative: 3 % (ref 0–5)
HCT: 40.7 % (ref 36.0–46.0)
Hemoglobin: 13.5 g/dL (ref 12.0–15.0)
LYMPHS PCT: 38 % (ref 12–46)
Lymphs Abs: 2.1 10*3/uL (ref 0.7–4.0)
MCH: 31.1 pg (ref 26.0–34.0)
MCHC: 33.2 g/dL (ref 30.0–36.0)
MCV: 93.8 fL (ref 78.0–100.0)
MONOS PCT: 7 % (ref 3–12)
MPV: 11 fL (ref 8.6–12.4)
Monocytes Absolute: 0.4 10*3/uL (ref 0.1–1.0)
NEUTROS ABS: 2.9 10*3/uL (ref 1.7–7.7)
NEUTROS PCT: 52 % (ref 43–77)
Platelets: 277 10*3/uL (ref 150–400)
RBC: 4.34 MIL/uL (ref 3.87–5.11)
RDW: 13.4 % (ref 11.5–15.5)
WBC: 5.5 10*3/uL (ref 4.0–10.5)

## 2014-11-10 LAB — T-HELPER CELL (CD4) - (RCID CLINIC ONLY)
CD4 % Helper T Cell: 45 % (ref 33–55)
CD4 T Cell Abs: 1040 /uL (ref 400–2700)

## 2014-11-10 LAB — RPR

## 2014-11-12 LAB — HIV-1 RNA QUANT-NO REFLEX-BLD
HIV 1 RNA Quant: <20 copies/mL (ref <20)
HIV-1 RNA Quant, Log: <1.3 {Log} (ref <1.30)

## 2014-11-13 ENCOUNTER — Other Ambulatory Visit: Payer: BLUE CROSS/BLUE SHIELD

## 2014-11-21 ENCOUNTER — Ambulatory Visit (INDEPENDENT_AMBULATORY_CARE_PROVIDER_SITE_OTHER): Payer: BLUE CROSS/BLUE SHIELD | Admitting: Internal Medicine

## 2014-11-21 ENCOUNTER — Encounter: Payer: Self-pay | Admitting: Internal Medicine

## 2014-11-21 VITALS — BP 138/88 | HR 67 | Temp 98.5°F | Ht 67.5 in | Wt 217.0 lb

## 2014-11-21 DIAGNOSIS — Z23 Encounter for immunization: Secondary | ICD-10-CM

## 2014-11-21 DIAGNOSIS — Z113 Encounter for screening for infections with a predominantly sexual mode of transmission: Secondary | ICD-10-CM | POA: Diagnosis not present

## 2014-11-21 DIAGNOSIS — B20 Human immunodeficiency virus [HIV] disease: Secondary | ICD-10-CM | POA: Diagnosis not present

## 2014-11-21 MED ORDER — EMTRICITAB-RILPIVIR-TENOFOV AF 200-25-25 MG PO TABS: 1.0000 | ORAL_TABLET | Freq: Every day | ORAL | Status: DC

## 2014-11-21 NOTE — Addendum Note (Signed)
Addended by: Landis Gandy on: 11/21/2014 12:35 PM   Modules accepted: Orders

## 2014-11-21 NOTE — Progress Notes (Signed)
  Subjective:    Patient ID: Carla Thomas, female    DOB: June 30, 1965, 49 y.o.   MRN: [id number]  HPI She comes in for routine followup. She continues to take Complera and denies any missed doses. She feels well is working daily and works a lot in her garden. She feels very positive and happy with her lab results which show a CD4 count of 740 and an undetectable viral load. She has been undetectable now for several years. No complaints, she gets her lipids managed by her primary doctor and her Pap smear and mammogram as well.     Review of Systems  Constitutional: Negative for fever and fatigue.  HENT: Negative for sore throat and trouble swallowing.   Respiratory: Negative for cough and shortness of breath.   Cardiovascular: Negative for chest pain.  Gastrointestinal: Negative for nausea and diarrhea.  Musculoskeletal: Negative for myalgias and arthralgias.  Skin: Negative for rash.  Neurological: Negative for dizziness, light-headedness and headaches.  Hematological: Negative for adenopathy.  Psychiatric/Behavioral: Negative for dysphoric mood.       Objective:   Physical Exam  Constitutional: She appears well-developed and well-nourished. No distress.  HENT:  Mouth/Throat: No oropharyngeal exudate.  Cardiovascular: Normal rate, regular rhythm and normal heart sounds.   No murmur heard. Pulmonary/Chest: Effort normal and breath sounds normal. No respiratory distress. She has no wheezes.  Lymphadenopathy:    She has no cervical adenopathy.  Skin: Skin is warm and dry. No rash noted.          Assessment & Plan:

## 2014-11-21 NOTE — Assessment & Plan Note (Signed)
She is doing very well. I will have her change to [facility] with her next refill if covered. If not covered she can continue with Complera. I will have asked her primary physician to check her viral load again in 6 months and I will see her in one year for her yearly visit. She knows to call if she has any problems in the meantime.

## 2015-01-12 ENCOUNTER — Other Ambulatory Visit: Payer: Self-pay | Admitting: Licensed Clinical Social Worker

## 2015-02-13 ENCOUNTER — Encounter: Payer: Self-pay | Admitting: Family Medicine

## 2015-02-13 ENCOUNTER — Ambulatory Visit (INDEPENDENT_AMBULATORY_CARE_PROVIDER_SITE_OTHER): Payer: BLUE CROSS/BLUE SHIELD | Admitting: Family Medicine

## 2015-02-13 VITALS — BP 128/70 | HR 70 | Temp 98.5°F | Ht 67.5 in | Wt 223.8 lb

## 2015-02-13 DIAGNOSIS — T148 Other injury of unspecified body region: Secondary | ICD-10-CM | POA: Diagnosis not present

## 2015-02-13 DIAGNOSIS — J302 Other seasonal allergic rhinitis: Secondary | ICD-10-CM

## 2015-02-13 DIAGNOSIS — W57XXXA Bitten or stung by nonvenomous insect and other nonvenomous arthropods, initial encounter: Secondary | ICD-10-CM | POA: Diagnosis not present

## 2015-02-13 MED ORDER — FLUTICASONE PROPIONATE 50 MCG/ACT NA SUSP: 2.0000 | Freq: Every day | NASAL | Status: DC

## 2015-02-13 MED ORDER — DOXYCYCLINE HYCLATE 100 MG PO TABS: 100.0000 mg | ORAL_TABLET | Freq: Two times a day (BID) | ORAL | Status: DC

## 2015-02-13 NOTE — Patient Instructions (Signed)
I think you have allergy congestion with some rebound congestion from the otc nasal spray  Stop the neo synephrine  Start flonase -use it through the allergy season If you develop fever or facial pain let me know  For the infected bug bite -take doxycycline as directed (take with non dairy food)  Keep clean with soap and water  Warm compresses may help also   If this worsens or you get red streaks or a fever let me know  Update if not starting to improve in a week or if worsening

## 2015-02-13 NOTE — Progress Notes (Signed)
Subjective:    Patient ID: Carla Thomas, female    DOB: 1965/11/14, 49 y.o.   MRN: [id number]  HPI Has a spot on her leg - and also sinus symptoms    Red spot on leg (R) - drained pus  Scratched it  Used neosporin  Oozing  Is sore (esp on her feet a lot) 1-2 weeks   Also sinus symptoms  ? Due to ragweed  Congested and tight in her face  Using neo synephrine nasal spray - ? If rebound congestion  No fever  No cough  Not a lot of mucous   Has used flonase in the past - it usually helps    Patient Active Problem List   Diagnosis Date Noted  . Sore throat 10/02/2014  . Screening examination for venereal disease 11/30/2013  . Encounter for routine gynecological examination 08/01/2013  . Rash and nonspecific skin eruption 07/06/2013  . Former smoker 06/02/2011  . Lump in neck 02/26/2011  . HIV disease 01/05/2011  . Exposure to STD 12/16/2010  . Other screening mammogram 11/12/2010  . Hypokalemia 11/07/2010  . Routine general medical examination at a health care facility 11/04/2010  . HYPERCHOLESTEROLEMIA 06/12/2010  . DYSPEPSIA 07/18/2009  . GOITER 10/16/2006  . [place] DISEASE 10/16/2006  . GOUT 10/16/2006  . ANXIETY 10/16/2006  . Essential hypertension 10/16/2006  . RHINITIS, ALLERGIC NEC 10/16/2006  . ASTHMA 10/16/2006  . Eczema 10/16/2006  . PLANTAR FASCIITIS 10/16/2006   Past Medical History  Diagnosis Date  . Anemia     iron deficiency  . Anxiety   . Asthma   . Gout   . Hypertension   . HIV (human immunodeficiency virus infection)   . Potassium (K) deficiency   . Dehydration    Past Surgical History  Procedure Laterality Date  . Cesarean section    . Abdominal hysterectomy  04/2006    partial for fibroids   Social History  Substance Use Topics  . Smoking status: Former Smoker    Types: Cigarettes  . Smokeless tobacco: Never Used     Comment: stop smoking in Sept  . Alcohol Use: 0.0 oz/week    0 Standard drinks or equivalent per week       Comment: occ   Family History  Problem Relation Age of Onset  . Cancer Brother     lung CA  . Cancer Maternal Grandmother     breast CA  . Heart failure Maternal Grandmother    Allergies  Allergen Reactions  . Codeine     REACTION: headaches  . Shellfish Allergy     Irritates gout  . Varenicline Tartrate   . Vitamin E     Hair loss  . Amoxicillin Rash   Current Outpatient Prescriptions on File Prior to Visit  Medication Sig Dispense Refill  . acetaminophen (TYLENOL) 500 MG tablet Take 1,000 mg by mouth every 6 (six) hours as needed for pain.    . calcium carbonate (OS-CAL - DOSED IN MG OF ELEMENTAL CALCIUM) 1250 MG tablet Take 1 tablet by mouth 2 (two) times daily.     . cholecalciferol (VITAMIN D) 400 UNITS TABS Take 400 Units by mouth daily.      . clobetasol cream (TEMOVATE) 6.73 % Apply 1 application topically 2 (two) times daily. Apply a small amount of cream to affected areas twice daily as needed 30 g 2  . Emtricitab-Rilpivir-Tenofov DF 200-25-300 MG TABS Take 1 tablet by mouth every evening. 90 tablet 3  .  hydrochlorothiazide (HYDRODIURIL) 25 MG tablet TAKE 1 TABLET EVERY MORNING 90 tablet 3  . phenylephrine (NEO-SYNEPHRINE) 1 % nasal spray Place 1 drop into both nostrils every 6 (six) hours as needed for congestion.     No current facility-administered medications on file prior to visit.      Review of Systems    Review of Systems  Constitutional: Negative for fever, appetite change,  and unexpected weight change.  ENt pos for congestion and neg for facial pain or purulent nasal d/c  Eyes: Negative for pain and visual disturbance.  Respiratory: Negative for wheeze  and shortness of breath.   Cardiovascular: Negative for cp or palpitations    Gastrointestinal: Negative for nausea, diarrhea and constipation.  Genitourinary: Negative for urgency and frequency.  Skin: Negative for pallor or rash  pos for infected scab/insect bite  Neurological: Negative for  weakness, light-headedness, numbness and headaches.  Hematological: Negative for adenopathy. Does not bruise/bleed easily.  Psychiatric/Behavioral: Negative for dysphoric mood. The patient is not nervous/anxious.      Objective:   Physical Exam  Constitutional: She appears well-developed and well-nourished. No distress.  obese and well appearing   HENT:  Head: Normocephalic and atraumatic.  Right Ear: External ear normal.  Left Ear: External ear normal.  Mouth/Throat: Oropharynx is clear and moist.  Nares are injected and congested  No sinus tenderness Clear rhinorrhea and post nasal drip   Eyes: Conjunctivae and EOM are normal. Pupils are equal, round, and reactive to light. Right eye exhibits no discharge. Left eye exhibits no discharge.  Neck: Normal range of motion. Neck supple.  Cardiovascular: Normal rate and normal heart sounds.   Pulmonary/Chest: Effort normal and breath sounds normal. No respiratory distress. She has no wheezes. She has no rales. She exhibits no tenderness.  Lymphadenopathy:    She has no cervical adenopathy.  Neurological: She is alert.  Skin: Skin is warm and dry. No rash noted. There is erythema.  1-2  Cm area of erythema and induration on R lower leg/shin  No drainage Not fluctuant Mildly tender  Small scab in center   Psychiatric: She has a normal mood and affect.          Assessment & Plan:   Problem List Items Addressed This Visit      Respiratory   Allergic rhinitis - Primary    Causing nasal congestion (along with rebound congestion from overuse of otc neo synephrine) Will stop that and start flonase daily through allergy season (which usually helps) Disc allergen avoidance as well  Update if not starting to improve in a week or if worsening  =esp if any facial pain or purulent drainage        Other   Insect bite    On R lower leg that appears to be infected  No drainage to cx however  tx with doxycycline Disc care with soap  and water/warm compresses  Rev s/s of cellulitis to watch for  Update

## 2015-02-13 NOTE — Assessment & Plan Note (Signed)
Causing nasal congestion (along with rebound congestion from overuse of otc neo synephrine) Will stop that and start flonase daily through allergy season (which usually helps) Disc allergen avoidance as well  Update if not starting to improve in a week or if worsening  =esp if any facial pain or purulent drainage

## 2015-02-13 NOTE — Progress Notes (Signed)
Pre visit review using our clinic review tool, if applicable. No additional management support is needed unless otherwise documented below in the visit note. 

## 2015-02-13 NOTE — Assessment & Plan Note (Signed)
On R lower leg that appears to be infected  No drainage to cx however  tx with doxycycline Disc care with soap and water/warm compresses  Rev s/s of cellulitis to watch for  Update

## 2015-02-26 ENCOUNTER — Other Ambulatory Visit: Payer: BLUE CROSS/BLUE SHIELD

## 2015-02-26 DIAGNOSIS — Z79899 Other long term (current) drug therapy: Secondary | ICD-10-CM

## 2015-02-26 DIAGNOSIS — B20 Human immunodeficiency virus [HIV] disease: Secondary | ICD-10-CM

## 2015-02-26 DIAGNOSIS — Z113 Encounter for screening for infections with a predominantly sexual mode of transmission: Secondary | ICD-10-CM

## 2015-02-26 LAB — COMPLETE METABOLIC PANEL WITH GFR
ALT: 16 U/L (ref 6–29)
AST: 21 U/L (ref 10–35)
Albumin: 4.4 g/dL (ref 3.6–5.1)
Alkaline Phosphatase: 69 U/L (ref 33–115)
BUN: 12 mg/dL (ref 7–25)
CHLORIDE: 100 mmol/L (ref 98–110)
CO2: 31 mmol/L (ref 20–31)
Calcium: 9.9 mg/dL (ref 8.6–10.2)
Creat: 0.72 mg/dL (ref 0.50–1.10)
GFR, Est African American: >89 mL/min (ref >=60)
GFR, Est Non African American: >89 mL/min (ref >=60)
GLUCOSE: 87 mg/dL (ref 65–99)
POTASSIUM: 4.2 mmol/L (ref 3.5–5.3)
SODIUM: 139 mmol/L (ref 135–146)
Total Bilirubin: 0.4 mg/dL (ref 0.2–1.2)
Total Protein: 7.7 g/dL (ref 6.1–8.1)

## 2015-02-26 LAB — CBC WITH DIFFERENTIAL/PLATELET
Basophils Absolute: 0 10*3/uL (ref 0.0–0.1)
Basophils Relative: 0 % (ref 0–1)
Eosinophils Absolute: 0.2 10*3/uL (ref 0.0–0.7)
Eosinophils Relative: 3 % (ref 0–5)
HEMATOCRIT: 39.3 % (ref 36.0–46.0)
HEMOGLOBIN: 13.7 g/dL (ref 12.0–15.0)
LYMPHS PCT: 34 % (ref 12–46)
Lymphs Abs: 1.9 10*3/uL (ref 0.7–4.0)
MCH: 31.8 pg (ref 26.0–34.0)
MCHC: 34.9 g/dL (ref 30.0–36.0)
MCV: 91.2 fL (ref 78.0–100.0)
MONO ABS: 0.5 10*3/uL (ref 0.1–1.0)
MONOS PCT: 8 % (ref 3–12)
MPV: 10.9 fL (ref 8.6–12.4)
NEUTROS ABS: 3.1 10*3/uL (ref 1.7–7.7)
Neutrophils Relative %: 55 % (ref 43–77)
Platelets: 275 10*3/uL (ref 150–400)
RBC: 4.31 MIL/uL (ref 3.87–5.11)
RDW: 14 % (ref 11.5–15.5)
WBC: 5.7 10*3/uL (ref 4.0–10.5)

## 2015-02-27 LAB — HIV-1 RNA QUANT-NO REFLEX-BLD

## 2015-02-27 LAB — RPR

## 2015-02-27 LAB — URINALYSIS, ROUTINE W REFLEX MICROSCOPIC
BILIRUBIN URINE: NEGATIVE
GLUCOSE, UA: NEGATIVE
Hgb urine dipstick: NEGATIVE
KETONES UR: NEGATIVE
Leukocytes, UA: NEGATIVE
Nitrite: NEGATIVE
Protein, ur: NEGATIVE
SPECIFIC GRAVITY, URINE: 1.015 (ref 1.001–1.035)
pH: 7.5 (ref 5.0–8.0)

## 2015-02-27 LAB — T-HELPER CELL (CD4) - (RCID CLINIC ONLY)
CD4 % Helper T Cell: 44 % (ref 33–55)
CD4 T Cell Abs: 770 /uL (ref 400–2700)

## 2015-02-27 LAB — URINE CYTOLOGY ANCILLARY ONLY
Chlamydia: NEGATIVE
NEISSERIA GONORRHEA: NEGATIVE

## 2015-03-04 ENCOUNTER — Encounter (HOSPITAL_COMMUNITY): Payer: Self-pay | Admitting: Emergency Medicine

## 2015-03-04 ENCOUNTER — Emergency Department (HOSPITAL_COMMUNITY)
Admission: EM | Admit: 2015-03-04 | Discharge: 2015-03-04 | Disposition: A | Discharge status: Home / Self Care | Payer: BLUE CROSS/BLUE SHIELD | Attending: Emergency Medicine | Admitting: Emergency Medicine

## 2015-03-04 DIAGNOSIS — R238 Other skin changes: Secondary | ICD-10-CM | POA: Diagnosis present

## 2015-03-04 DIAGNOSIS — Z91040 Latex allergy status: Secondary | ICD-10-CM | POA: Insufficient documentation

## 2015-03-04 DIAGNOSIS — I10 Essential (primary) hypertension: Secondary | ICD-10-CM | POA: Diagnosis not present

## 2015-03-04 DIAGNOSIS — Z87891 Personal history of nicotine dependence: Secondary | ICD-10-CM | POA: Insufficient documentation

## 2015-03-04 DIAGNOSIS — Z8739 Personal history of other diseases of the musculoskeletal system and connective tissue: Secondary | ICD-10-CM | POA: Diagnosis not present

## 2015-03-04 DIAGNOSIS — Z79899 Other long term (current) drug therapy: Secondary | ICD-10-CM | POA: Insufficient documentation

## 2015-03-04 DIAGNOSIS — J45909 Unspecified asthma, uncomplicated: Secondary | ICD-10-CM | POA: Diagnosis not present

## 2015-03-04 DIAGNOSIS — Z8659 Personal history of other mental and behavioral disorders: Secondary | ICD-10-CM | POA: Insufficient documentation

## 2015-03-04 DIAGNOSIS — Z8639 Personal history of other endocrine, nutritional and metabolic disease: Secondary | ICD-10-CM | POA: Insufficient documentation

## 2015-03-04 DIAGNOSIS — L03115 Cellulitis of right lower limb: Secondary | ICD-10-CM | POA: Diagnosis not present

## 2015-03-04 DIAGNOSIS — B20 Human immunodeficiency virus [HIV] disease: Secondary | ICD-10-CM | POA: Insufficient documentation

## 2015-03-04 DIAGNOSIS — Z862 Personal history of diseases of the blood and blood-forming organs and certain disorders involving the immune mechanism: Secondary | ICD-10-CM | POA: Insufficient documentation

## 2015-03-04 MED ORDER — CLINDAMYCIN HCL 150 MG PO CAPS: 150.0000 mg | ORAL_CAPSULE | Freq: Four times a day (QID) | ORAL | Status: DC

## 2015-03-04 NOTE — ED Notes (Signed)
States she was breaking down boxes at work when she scratched her leg on the cardboard. The scratch began to grow as a small bump that became red and inflamed. Saw her PCP on Wed 9/7, who gave her doxycycline. Pt took full course with little change in the wound. Currently presents with an inflamed and painful area to inside of R lower leg that has an open sore with dark ring around it.

## 2015-03-04 NOTE — ED Provider Notes (Signed)
CSN: [id number]     Arrival date & time 03/04/15  1624 History   First MD Initiated Contact with Patient 03/04/15 [phone]     Chief Complaint  Patient presents with  . Cellulitis     (Consider location/radiation/quality/duration/timing/severity/associated sxs/prior Treatment) HPI  Pt presenting with c/o redness overlying right lower extremity.  She has finished course of doxycycline approx one week ago and states the area appeared to be healing.  Yesterday she noted the area was itching and red again, this morning the redness had increased.  No fever/chills or other systemic symptoms.  No drainage from the area.  There are no other associated systemic symptoms, there are no other alleviating or modifying factors.   Past Medical History  Diagnosis Date  . Anemia     iron deficiency  . Anxiety   . Asthma   . Gout   . Hypertension   . HIV (human immunodeficiency virus infection)   . Potassium (K) deficiency   . Dehydration    Past Surgical History  Procedure Laterality Date  . Cesarean section    . Abdominal hysterectomy  04/2006    partial for fibroids   Family History  Problem Relation Age of Onset  . Cancer Brother     lung CA  . Cancer Maternal Grandmother     breast CA  . Heart failure Maternal Grandmother    Social History  Substance Use Topics  . Smoking status: Former Smoker    Types: Cigarettes  . Smokeless tobacco: Never Used     Comment: stop smoking in Sept  . Alcohol Use: No     Comment: occ   OB History    No data available     Review of Systems  ROS reviewed and all otherwise negative except for mentioned in HPI    Allergies  Latex; Codeine; Shellfish allergy; Varenicline tartrate; Vitamin e; and Amoxicillin  Home Medications   Prior to Admission medications   Medication Sig Start Date End Date Taking? Authorizing Provider  acetaminophen (TYLENOL) 500 MG tablet Take 1,000 mg by mouth every 6 (six) hours as needed for pain.   Yes Historical  Provider, MD  calcium carbonate (OS-CAL - DOSED IN MG OF ELEMENTAL CALCIUM) 1250 MG tablet Take 1 tablet by mouth 2 (two) times daily.    Yes Historical Provider, MD  cholecalciferol (VITAMIN D) 400 UNITS TABS Take 400 Units by mouth daily.     Yes Historical Provider, MD  clobetasol cream (TEMOVATE) 0.72 % Apply 1 application topically 2 (two) times daily. Apply a small amount of cream to affected areas twice daily as needed 05/16/14  Yes Abner Greenspan, MD  Emtricitab-Rilpivir-Tenofov DF 200-25-300 MG TABS Take 1 tablet by mouth every evening. 10/11/14  Yes Thayer Headings, MD  fluticasone (FLONASE) 50 MCG/ACT nasal spray Place 2 sprays into both nostrils daily. 02/13/15  Yes Abner Greenspan, MD  hydrochlorothiazide (HYDRODIURIL) 25 MG tablet TAKE 1 TABLET EVERY MORNING 08/07/14  Yes Abner Greenspan, MD  clindamycin (CLEOCIN) 150 MG capsule Take 1 capsule (150 mg total) by mouth every 6 (six) hours. 03/04/15   Alfonzo Beers, MD  doxycycline (VIBRA-TABS) 100 MG tablet Take 1 tablet (100 mg total) by mouth 2 (two) times daily. Patient not taking: Reported on 03/04/2015 02/13/15   Abner Greenspan, MD   BP 154/84 mmHg  Pulse 74  Temp(Src) 98.2 F (36.8 C) (Oral)  Resp 18  Ht 5\' 7"  (1.702 m)  Wt 231 lb (104.781  kg)  BMI 36.17 kg/m2  SpO2 97%  Vitals reviewed Physical Exam  Physical Examination: General appearance - alert, well appearing, and in no distress Mental status - alert, oriented to person, place, and time Eyes - no conjunctival injection, no scleral icterus Chest - clear to auscultation, no wheezes, rales or rhonchi, symmetric air entry Heart - normal rate, regular rhythm, normal S1, S2, no murmurs, rubs, clicks or gallops Neurological - alert, oriented, normal speech Musculoskeletal - no joint tenderness, deformity or swelling Extremities - peripheral pulses normal, no pedal edema, no clubbing or cyanosis Skin - normal coloration and turgor other than erythema of right lower extremity approx 4cm  inferior to area of prior insect bite- hyperpigmentation around prior insect bite, no induration or fluctuance, no prurulent drainage  ED Course  Procedures (including critical care time) Labs Review Labs Reviewed - No data to display  Imaging Review No results found. I have personally reviewed and evaluated these images and lab results as part of my medical decision-making.   EKG Interpretation None      MDM   Final diagnoses:  Cellulitis of right lower extremity    Pt with cellulitis of right lower extremity- she finished course of doxycycline, now new area of cellulitis has appeared- pt will be treated with clindamycin, she does not have signs or symptoms of systemic illness.  Discharged with strict return precautions.  Pt agreeable with plan.    Alfonzo Beers, MD 03/04/15 [phone]

## 2015-03-04 NOTE — Discharge Instructions (Signed)
Return to the ED with any concerns including fever/chills, vomiting and not able to keep down liquids or antibiotics, increasing redness of lower extremities, decreased level of alertness/lethargy, or any other alarming symptoms

## 2015-05-01 ENCOUNTER — Ambulatory Visit (INDEPENDENT_AMBULATORY_CARE_PROVIDER_SITE_OTHER): Payer: BLUE CROSS/BLUE SHIELD | Admitting: Internal Medicine

## 2015-05-01 ENCOUNTER — Encounter: Payer: Self-pay | Admitting: Internal Medicine

## 2015-05-01 VITALS — BP 162/74 | HR 71 | Temp 97.9°F | Wt 227.0 lb

## 2015-05-01 DIAGNOSIS — I10 Essential (primary) hypertension: Secondary | ICD-10-CM

## 2015-05-01 DIAGNOSIS — F411 Generalized anxiety disorder: Secondary | ICD-10-CM | POA: Diagnosis not present

## 2015-05-01 DIAGNOSIS — R21 Rash and other nonspecific skin eruption: Secondary | ICD-10-CM | POA: Diagnosis not present

## 2015-05-01 DIAGNOSIS — B20 Human immunodeficiency virus [HIV] disease: Secondary | ICD-10-CM | POA: Diagnosis not present

## 2015-05-01 NOTE — Assessment & Plan Note (Signed)
Elevated today, will f/u with her PCP

## 2015-05-01 NOTE — Progress Notes (Signed)
CC: Follow up for HIV  Interval history: Currently is asymptomatic and well-controlled on [place].  Since last visit in June, has changed to [facility]  Has no associated symptoms.  Denies any missed doses. CD4 770, viral load undetectable.    Also has had a recent rash, first on right leg then left groin area.  Some redness and warmth.  Resolved now.  No fever or chills.    Prior to Admission medications   Medication Sig Start Date End Date Taking? Authorizing Provider  acetaminophen (TYLENOL) 500 MG tablet Take 1,000 mg by mouth every 6 (six) hours as needed for pain.   Yes Historical Provider, MD  calcium carbonate (OS-CAL - DOSED IN MG OF ELEMENTAL CALCIUM) 1250 MG tablet Take 1 tablet by mouth 2 (two) times daily.    Yes Historical Provider, MD  cholecalciferol (VITAMIN D) 400 UNITS TABS Take 400 Units by mouth daily.     Yes Historical Provider, MD  Emtricitab-Rilpivir-Tenofov DF 200-25-300 MG TABS Take 1 tablet by mouth every evening. 10/11/14  Yes Thayer Headings, MD  fluticasone (FLONASE) 50 MCG/ACT nasal spray Place 2 sprays into both nostrils daily. 02/13/15  Yes Abner Greenspan, MD  hydrochlorothiazide (HYDRODIURIL) 25 MG tablet TAKE 1 TABLET EVERY MORNING 08/07/14  Yes Abner Greenspan, MD    Review of Systems Constitutional: negative for fatigue and malaise Gastrointestinal: negative for diarrhea All other systems reviewed and are negative   Physical Exam: CONSTITUTIONAL:in no apparent distress and alert  Filed Vitals:   05/01/15 1522  BP: 162/74  Pulse: 71  Temp: 97.9 F (36.6 C)   Eyes: anicteric HENT: no thrush, no cervical lymphadenopathy Respiratory: Normal respiratory effort; CTA B Skin: no rashes  Lab Results  Component Value Date   HIV1RNAQUANT <20 02/26/2015   HIV1RNAQUANT <20 11/09/2014   HIV1RNAQUANT <20 11/16/2013   No components found for: HIV1GENOTYPRPLUS No components found for: THELPERCELL

## 2015-05-01 NOTE — Assessment & Plan Note (Signed)
Doing great, rtc 6 months.

## 2015-05-01 NOTE — Assessment & Plan Note (Signed)
Resolved.  I wonder if mild MRSA skin rash.  Will try bleach baths x 3, 1 week apart.

## 2015-08-29 ENCOUNTER — Other Ambulatory Visit: Payer: Self-pay

## 2015-08-29 DIAGNOSIS — Z1231 Encounter for screening mammogram for malignant neoplasm of breast: Secondary | ICD-10-CM

## 2015-09-12 ENCOUNTER — Other Ambulatory Visit: Payer: Self-pay | Admitting: Family Medicine

## 2015-09-12 NOTE — Telephone Encounter (Signed)
Please schedule PE for fall and refill until then

## 2015-09-12 NOTE — Telephone Encounter (Signed)
Electronic refill request, no recent or future f/u or CPE appts, please advise

## 2015-09-14 NOTE — Telephone Encounter (Signed)
Left voicemail requesting pt to call office back and schedule her CPE before we refill her med

## 2015-09-18 NOTE — Telephone Encounter (Signed)
Left 2nd voicemail requesting pt to call office back and schedule CPE before we can refill med

## 2015-09-27 ENCOUNTER — Ambulatory Visit
Admission: RE | Admit: 2015-09-27 | Discharge: 2015-09-27 | Disposition: A | Discharge status: Home / Self Care | Payer: BLUE CROSS/BLUE SHIELD | Source: Ambulatory Visit

## 2015-09-27 DIAGNOSIS — Z1231 Encounter for screening mammogram for malignant neoplasm of breast: Secondary | ICD-10-CM

## 2015-09-30 ENCOUNTER — Telehealth: Payer: Self-pay | Admitting: Family Medicine

## 2015-09-30 DIAGNOSIS — Z0000 Encounter for general adult medical examination without abnormal findings: Secondary | ICD-10-CM

## 2015-09-30 NOTE — Telephone Encounter (Signed)
-----   Message from Marchia Bond sent at 09/27/2015 10:45 AM EDT ----- Regarding: Cpx labs Wed 4/26, need orders. Thanks! :-) Please order  future cpx labs for pt's upcoming lab appt. Thanks Aniceto Boss

## 2015-10-03 ENCOUNTER — Other Ambulatory Visit: Payer: BLUE CROSS/BLUE SHIELD

## 2015-10-04 ENCOUNTER — Other Ambulatory Visit (INDEPENDENT_AMBULATORY_CARE_PROVIDER_SITE_OTHER): Payer: BLUE CROSS/BLUE SHIELD

## 2015-10-04 DIAGNOSIS — Z0000 Encounter for general adult medical examination without abnormal findings: Secondary | ICD-10-CM

## 2015-10-04 LAB — CBC WITH DIFFERENTIAL/PLATELET
BASOS PCT: 0.3 % (ref 0.0–3.0)
Basophils Absolute: 0 10*3/uL (ref 0.0–0.1)
EOS ABS: 0.3 10*3/uL (ref 0.0–0.7)
Eosinophils Relative: 4.4 % (ref 0.0–5.0)
HCT: 38 % (ref 36.0–46.0)
Hemoglobin: 12.9 g/dL (ref 12.0–15.0)
Lymphocytes Relative: 25.6 % (ref 12.0–46.0)
Lymphs Abs: 1.5 10*3/uL (ref 0.7–4.0)
MCHC: 33.9 g/dL (ref 30.0–36.0)
MCV: 92.4 fl (ref 78.0–100.0)
MONO ABS: 0.4 10*3/uL (ref 0.1–1.0)
Monocytes Relative: 6.3 % (ref 3.0–12.0)
NEUTROS ABS: 3.7 10*3/uL (ref 1.4–7.7)
Neutrophils Relative %: 63.4 % (ref 43.0–77.0)
PLATELETS: 241 10*3/uL (ref 150.0–400.0)
RBC: 4.11 Mil/uL (ref 3.87–5.11)
RDW: 13.8 % (ref 11.5–15.5)
WBC: 5.9 10*3/uL (ref 4.0–10.5)

## 2015-10-04 LAB — LIPID PANEL
CHOL/HDL RATIO: 4
Cholesterol: 174 mg/dL (ref 0–200)
HDL: 42.5 mg/dL (ref >39.00)
LDL CALC: 116 mg/dL — AB (ref 0–99)
NonHDL: 131.18
TRIGLYCERIDES: 75 mg/dL (ref 0.0–149.0)
VLDL: 15 mg/dL (ref 0.0–40.0)

## 2015-10-04 LAB — COMPREHENSIVE METABOLIC PANEL
ALT: 14 U/L (ref 0–35)
AST: 16 U/L (ref 0–37)
Albumin: 4 g/dL (ref 3.5–5.2)
Alkaline Phosphatase: 49 U/L (ref 39–117)
BUN: 12 mg/dL (ref 6–23)
CHLORIDE: 105 meq/L (ref 96–112)
CO2: 32 meq/L (ref 19–32)
CREATININE: 0.72 mg/dL (ref 0.40–1.20)
Calcium: 9.2 mg/dL (ref 8.4–10.5)
GFR: 110.41 mL/min (ref >60.00)
GLUCOSE: 97 mg/dL (ref 70–99)
Potassium: 4 mEq/L (ref 3.5–5.1)
SODIUM: 142 meq/L (ref 135–145)
Total Bilirubin: 0.4 mg/dL (ref 0.2–1.2)
Total Protein: 7.2 g/dL (ref 6.0–8.3)

## 2015-10-04 LAB — TSH: TSH: 4.01 u[IU]/mL (ref 0.35–4.50)

## 2015-10-10 ENCOUNTER — Encounter: Payer: Self-pay | Admitting: Family Medicine

## 2015-10-10 ENCOUNTER — Ambulatory Visit (INDEPENDENT_AMBULATORY_CARE_PROVIDER_SITE_OTHER): Payer: BLUE CROSS/BLUE SHIELD | Admitting: Family Medicine

## 2015-10-10 VITALS — BP 120/76 | HR 74 | Temp 98.4°F | Ht 67.5 in | Wt 227.0 lb

## 2015-10-10 DIAGNOSIS — E7800 Pure hypercholesterolemia, unspecified: Secondary | ICD-10-CM | POA: Diagnosis not present

## 2015-10-10 DIAGNOSIS — I10 Essential (primary) hypertension: Secondary | ICD-10-CM | POA: Diagnosis not present

## 2015-10-10 DIAGNOSIS — R21 Rash and other nonspecific skin eruption: Secondary | ICD-10-CM

## 2015-10-10 DIAGNOSIS — Z0000 Encounter for general adult medical examination without abnormal findings: Secondary | ICD-10-CM

## 2015-10-10 DIAGNOSIS — B20 Human immunodeficiency virus [HIV] disease: Secondary | ICD-10-CM

## 2015-10-10 MED ORDER — HYDROCHLOROTHIAZIDE 25 MG PO TABS: ORAL_TABLET | ORAL | Status: DC

## 2015-10-10 MED ORDER — CLOBETASOL PROPIONATE 0.05 % EX OINT: 1.0000 "application " | TOPICAL_OINTMENT | Freq: Two times a day (BID) | CUTANEOUS | Status: DC

## 2015-10-10 NOTE — Progress Notes (Signed)
Pre visit review using our clinic review tool, if applicable. No additional management support is needed unless otherwise documented below in the visit note. 

## 2015-10-10 NOTE — Progress Notes (Signed)
Subjective:    Patient ID: Carla Thomas, female    DOB: 04-12-1966, 50 y.o.   MRN: [id number]  HPI Here for health maintenance exam and to review chronic medical problems    Is working a lot  Also gardening a lot   Wt is down 4 lb with bmi of 22 In obese category Working outdoors more - more exercise  Also joined a gym and goes regularly  Eating a healthy diet as well  Protien/chicken/oatmeal/fruit Willa Frater   Flu shot 10/16  Pap 2/15 nl with no hpv  Had trich at the time and it was treated  No exposures since  Not sexually active No symptoms  Does not desire pap or STD screening  Has had a partial hysterectomy for fibroids in the past   Mm 4/17 negative  Self breast exam -no lumps or changes   Getting dental work  Has to have one removed   Td 2/16  Hx of Graves dz Lab Results  Component Value Date   TSH 4.01 10/04/2015   no problems and goiter has not changed  Cholesterol Lab Results  Component Value Date   CHOL 174 10/04/2015   CHOL 152 08/07/2014   CHOL 145 07/25/2013   Lab Results  Component Value Date   HDL 42.50 10/04/2015   HDL 36.80* 08/07/2014   HDL 45.40 07/25/2013   Lab Results  Component Value Date   LDLCALC 116* 10/04/2015   LDLCALC 106* 08/07/2014   LDLCALC 93 07/25/2013   Lab Results  Component Value Date   TRIG 75.0 10/04/2015   TRIG 44.0 08/07/2014   TRIG 34.0 07/25/2013   Lab Results  Component Value Date   CHOLHDL 4 10/04/2015   CHOLHDL 4 08/07/2014   CHOLHDL 3 07/25/2013   Lab Results  Component Value Date   LDLDIRECT 154.6 12/16/2010   avoids fried foods  Overall LDL up - egg yolks  HDL is up from exercise   bp is stable today  No cp or palpitations or headaches or edema  No side effects to medicines  BP Readings from Last 3 Encounters:  10/10/15 120/76  05/01/15 162/74  03/04/15 154/84      Patient Active Problem List   Diagnosis Date Noted  . Insect bite 02/13/2015  . Sore throat 10/02/2014  .  Screening examination for venereal disease 11/30/2013  . Encounter for routine gynecological examination 08/01/2013  . Rash and nonspecific skin eruption 07/06/2013  . Former smoker 06/02/2011  . Lump in neck 02/26/2011  . HIV disease ([place]) 01/05/2011  . Exposure to STD 12/16/2010  . Other screening mammogram 11/12/2010  . Hypokalemia 11/07/2010  . Routine general medical examination at a health care facility 11/04/2010  . HYPERCHOLESTEROLEMIA 06/12/2010  . DYSPEPSIA 07/18/2009  . GOITER 10/16/2006  . [place] DISEASE 10/16/2006  . GOUT 10/16/2006  . Anxiety state 10/16/2006  . Essential hypertension 10/16/2006  . Allergic rhinitis 10/16/2006  . ASTHMA 10/16/2006  . Eczema 10/16/2006  . PLANTAR FASCIITIS 10/16/2006   Past Medical History  Diagnosis Date  . Anemia     iron deficiency  . Anxiety   . Asthma   . Gout   . Hypertension   . HIV (human immunodeficiency virus infection) ([place])   . Potassium (K) deficiency   . Dehydration    Past Surgical History  Procedure Laterality Date  . Cesarean section    . Abdominal hysterectomy  04/2006    partial for fibroids   Social History  Substance  Use Topics  . Smoking status: Former Smoker    Types: Cigarettes  . Smokeless tobacco: Never Used     Comment: stop smoking in Sept  . Alcohol Use: No     Comment: occ   Family History  Problem Relation Age of Onset  . Cancer Brother     lung CA  . Cancer Maternal Grandmother     breast CA  . Heart failure Maternal Grandmother    Allergies  Allergen Reactions  . Latex Anaphylaxis  . Codeine     REACTION: headaches  . Shellfish Allergy     Irritates gout  . Varenicline Tartrate     Bad dreams  . Vitamin E     Hair loss  . Amoxicillin Rash    Has patient had a PCN reaction causing immediate rash, facial/tongue/throat swelling, SOB or lightheadedness with hypotension: No Has patient had a PCN reaction causing severe rash involving mucus membranes or skin necrosis:  No Has patient had a PCN reaction that required hospitalization No Has patient had a PCN reaction occurring within the last 10 years: yes.Marland Kitchen5-6 years ago If all of the above answers are "NO", then may proceed with Cephalosporin use.     Current Outpatient Prescriptions on File Prior to Visit  Medication Sig Dispense Refill  . acetaminophen (TYLENOL) 500 MG tablet Take 1,000 mg by mouth every 6 (six) hours as needed for pain.    . calcium carbonate (OS-CAL - DOSED IN MG OF ELEMENTAL CALCIUM) 1250 MG tablet Take 1 tablet by mouth 2 (two) times daily.     . cholecalciferol (VITAMIN D) 400 UNITS TABS Take 400 Units by mouth daily.      . Emtricitab-Rilpivir-Tenofov DF 200-25-300 MG TABS Take 1 tablet by mouth every evening. 90 tablet 3  . fluticasone (FLONASE) 50 MCG/ACT nasal spray Place 2 sprays into both nostrils daily. 16 g 11  . hydrochlorothiazide (HYDRODIURIL) 25 MG tablet TAKE 1 TABLET EVERY MORNING 90 tablet 3   No current facility-administered medications on file prior to visit.        Review of Systems    Review of Systems  Constitutional: Negative for fever, appetite change, fatigue and unexpected weight change.  Eyes: Negative for pain and visual disturbance.  Respiratory: Negative for cough and shortness of breath.   Cardiovascular: Negative for cp or palpitations    Gastrointestinal: Negative for nausea, diarrhea and constipation.  Genitourinary: Negative for urgency and frequency.  Skin: Negative for pallor or rash  pos for itching of R lower leg in area of prev rash (that scarred) - ? What it was  Neurological: Negative for weakness, light-headedness, numbness and headaches.  Hematological: Negative for adenopathy. Does not bruise/bleed easily.  Psychiatric/Behavioral: Negative for dysphoric mood. The patient is not nervous/anxious.      Objective:   Physical Exam  Constitutional: She appears well-developed and well-nourished. No distress.  obese and well appearing    HENT:  Head: Normocephalic and atraumatic.  Right Ear: External ear normal.  Left Ear: External ear normal.  Mouth/Throat: Oropharynx is clear and moist.  Eyes: Conjunctivae and EOM are normal. Pupils are equal, round, and reactive to light. No scleral icterus.  Neck: Normal range of motion. Neck supple. No JVD present. Carotid bruit is not present. No thyromegaly present.  Cardiovascular: Normal rate, regular rhythm, normal heart sounds and intact distal pulses.  Exam reveals no gallop.   Pulmonary/Chest: Effort normal and breath sounds normal. No respiratory distress. She has no wheezes.  She exhibits no tenderness.  Abdominal: Soft. Bowel sounds are normal. She exhibits no distension, no abdominal bruit and no mass. There is no tenderness.  Genitourinary: No breast swelling, tenderness, discharge or bleeding.  Breast exam: No mass, nodules, thickening, tenderness, bulging, retraction, inflamation, nipple discharge or skin changes noted.  No axillary or clavicular LA.      Musculoskeletal: Normal range of motion. She exhibits no edema or tenderness.  Lymphadenopathy:    She has no cervical adenopathy.  Neurological: She is alert. She has normal reflexes. No cranial nerve deficit. She exhibits normal muscle tone. Coordination normal.  Skin: Skin is warm and dry. No rash noted. No erythema. No pallor.  Pt scars easily (not keloid but hyperpigmentation)  R lower leg- several round to oval flat areas of hyperpigmentation that resemble old insect bites No redness or swelling   Psychiatric: She has a normal mood and affect.          Assessment & Plan:   Problem List Items Addressed This Visit      Cardiovascular and Mediastinum   Essential hypertension - Primary    bp in fair control at this time  BP Readings from Last 1 Encounters:  10/10/15 120/76   No changes needed Disc lifstyle change with low sodium diet and exercise   Labs reviewed       Relevant Medications    hydrochlorothiazide (HYDRODIURIL) 25 MG tablet     Musculoskeletal and Integument   Rash and nonspecific skin eruption    Areas of hyperpigmentation on R leg - look like scars from old bug bites or dermatitis  She still c/o of itching  Refilled temovate oint for use prn sparingly  Watch for redness or swelling  Has ID f/u soon - will ask them to look at this         Other   HIV disease ([place])    Doing very well with current medicines  No complaints  F/u with ID is upcoming       HYPERCHOLESTEROLEMIA    Disc goals for lipids and reasons to control them Rev labs with pt Rev low sat fat diet in detail       Relevant Medications   hydrochlorothiazide (HYDRODIURIL) 25 MG tablet   Routine general medical examination at a health care facility    Reviewed health habits including diet and exercise and skin cancer prevention Reviewed appropriate screening tests for age  Also reviewed health mt list, fam hx and immunization status , as well as social and family history   See HPI Labs reviewed  Take care of yourself  Eat healthy and keep exercising  Labs are ok/ stable Watch diet for cholesterol --- Avoid red meat/ fried foods/ egg yolks/ fatty breakfast meats/ butter, cheese and high fat dairy/ and shellfish   Try the temovate on your leg rash

## 2015-10-10 NOTE — Patient Instructions (Signed)
Take care of yourself  Eat healthy and keep exercising  Labs are ok/ stable Watch diet for cholesterol --- Avoid red meat/ fried foods/ egg yolks/ fatty breakfast meats/ butter, cheese and high fat dairy/ and shellfish   Try the temovate on your leg rash

## 2015-10-11 NOTE — Assessment & Plan Note (Signed)
Disc goals for lipids and reasons to control them Rev labs with pt Rev low sat fat diet in detail   

## 2015-10-11 NOTE — Assessment & Plan Note (Signed)
Areas of hyperpigmentation on R leg - look like scars from old bug bites or dermatitis  She still c/o of itching  Refilled temovate oint for use prn sparingly  Watch for redness or swelling  Has ID f/u soon - will ask them to look at this

## 2015-10-11 NOTE — Assessment & Plan Note (Signed)
Doing very well with current medicines  No complaints  F/u with ID is upcoming

## 2015-10-11 NOTE — Assessment & Plan Note (Signed)
Reviewed health habits including diet and exercise and skin cancer prevention Reviewed appropriate screening tests for age  Also reviewed health mt list, fam hx and immunization status , as well as social and family history   See HPI Labs reviewed  Take care of yourself  Eat healthy and keep exercising  Labs are ok/ stable Watch diet for cholesterol --- Avoid red meat/ fried foods/ egg yolks/ fatty breakfast meats/ butter, cheese and high fat dairy/ and shellfish   Try the temovate on your leg rash

## 2015-10-11 NOTE — Assessment & Plan Note (Signed)
bp in fair control at this time  BP Readings from Last 1 Encounters:  10/10/15 120/76   No changes needed Disc lifstyle change with low sodium diet and exercise   Labs reviewed

## 2015-10-15 ENCOUNTER — Other Ambulatory Visit: Payer: BLUE CROSS/BLUE SHIELD

## 2015-10-18 ENCOUNTER — Other Ambulatory Visit: Payer: BLUE CROSS/BLUE SHIELD

## 2015-10-18 DIAGNOSIS — B20 Human immunodeficiency virus [HIV] disease: Secondary | ICD-10-CM

## 2015-10-19 LAB — T-HELPER CELL (CD4) - (RCID CLINIC ONLY)
CD4 T CELL ABS: 840 /uL (ref 400–2700)
CD4 T CELL HELPER: 48 % (ref 33–55)

## 2015-10-19 LAB — HIV-1 RNA QUANT-NO REFLEX-BLD
HIV 1 RNA Quant: <20 copies/mL (ref <20)
HIV-1 RNA Quant, Log: <1.3 Log copies/mL (ref <1.30)

## 2015-10-29 ENCOUNTER — Ambulatory Visit: Payer: BLUE CROSS/BLUE SHIELD | Admitting: Internal Medicine

## 2015-11-06 ENCOUNTER — Ambulatory Visit (INDEPENDENT_AMBULATORY_CARE_PROVIDER_SITE_OTHER): Payer: BLUE CROSS/BLUE SHIELD | Admitting: Internal Medicine

## 2015-11-06 VITALS — Wt 231.1 lb

## 2015-11-06 DIAGNOSIS — R21 Rash and other nonspecific skin eruption: Secondary | ICD-10-CM | POA: Diagnosis not present

## 2015-11-06 DIAGNOSIS — Z113 Encounter for screening for infections with a predominantly sexual mode of transmission: Secondary | ICD-10-CM | POA: Diagnosis not present

## 2015-11-06 DIAGNOSIS — B20 Human immunodeficiency virus [HIV] disease: Secondary | ICD-10-CM

## 2015-11-06 MED ORDER — EMTRICITAB-RILPIVIR-TENOFOV AF 200-25-25 MG PO TABS: 1.0000 | ORAL_TABLET | Freq: Every day | ORAL | Status: DC

## 2015-11-06 NOTE — Assessment & Plan Note (Signed)
Doing great.  Continue Odefsey, rtc 1 year.

## 2015-11-06 NOTE — Progress Notes (Signed)
CC: Follow up for HIV  Interval history: Currently is asymptomatic and well-controlled on Odefsey.  Has no associated symptoms.  Denies any missed doses. CD4 840, viral load undetectable.    Also has had a recent rash following skin infection after working in her garden last September.    Prior to Admission medications   Medication Sig Start Date End Date Taking? Authorizing Provider  acetaminophen (TYLENOL) 500 MG tablet Take 1,000 mg by mouth every 6 (six) hours as needed for pain.   Yes Historical Provider, MD  calcium carbonate (OS-CAL - DOSED IN MG OF ELEMENTAL CALCIUM) 1250 MG tablet Take 1 tablet by mouth 2 (two) times daily.    Yes Historical Provider, MD  cholecalciferol (VITAMIN D) 400 UNITS TABS Take 400 Units by mouth daily.     Yes Historical Provider, MD  Emtricitab-Rilpivir-Tenofov DF 200-25-300 MG TABS Take 1 tablet by mouth every evening. 10/11/14  Yes Thayer Headings, MD  fluticasone (FLONASE) 50 MCG/ACT nasal spray Place 2 sprays into both nostrils daily. 02/13/15  Yes Abner Greenspan, MD  hydrochlorothiazide (HYDRODIURIL) 25 MG tablet TAKE 1 TABLET EVERY MORNING 08/07/14  Yes Abner Greenspan, MD    Review of Systems Constitutional: negative for fatigue and malaise Gastrointestinal: negative for diarrhea All other systems reviewed and are negative   Physical Exam: CONSTITUTIONAL:in no apparent distress and alert  There were no vitals filed for this visit. Eyes: anicteric HENT: no thrush, no cervical lymphadenopathy Respiratory: Normal respiratory effort; CTA B Skin: some hyperpigmented areas on right lower leg, no active infection  Lab Results  Component Value Date   HIV1RNAQUANT <20 10/18/2015   HIV1RNAQUANT <20 02/26/2015   HIV1RNAQUANT <20 11/09/2014   No components found for: HIV1GENOTYPRPLUS No components found for: THELPERCELL

## 2015-11-06 NOTE — Assessment & Plan Note (Signed)
Rash on legs from previous cellulitis, likely was impetigo.

## 2016-05-06 ENCOUNTER — Other Ambulatory Visit: Payer: Self-pay | Admitting: Family Medicine

## 2016-05-06 DIAGNOSIS — Z1231 Encounter for screening mammogram for malignant neoplasm of breast: Secondary | ICD-10-CM

## 2016-08-18 ENCOUNTER — Telehealth: Payer: Self-pay | Admitting: *Deleted

## 2016-08-18 DIAGNOSIS — Z1211 Encounter for screening for malignant neoplasm of colon: Secondary | ICD-10-CM | POA: Insufficient documentation

## 2016-08-18 NOTE — Telephone Encounter (Signed)
Left message asking pt to call office  °

## 2016-08-18 NOTE — Telephone Encounter (Signed)
Referral done Will route to [facility]  

## 2016-08-18 NOTE — Telephone Encounter (Signed)
PT would like to have a colonoscopy done before her visit in May for her physical. She does not have a referral in the system.

## 2016-09-09 ENCOUNTER — Encounter: Payer: Self-pay | Admitting: Internal Medicine

## 2016-09-29 ENCOUNTER — Ambulatory Visit
Admission: RE | Admit: 2016-09-29 | Discharge: 2016-09-29 | Disposition: A | Discharge status: Home / Self Care | Payer: 59 | Source: Ambulatory Visit | Attending: Family Medicine | Admitting: Family Medicine

## 2016-09-29 DIAGNOSIS — Z1231 Encounter for screening mammogram for malignant neoplasm of breast: Secondary | ICD-10-CM | POA: Diagnosis not present

## 2016-09-30 ENCOUNTER — Other Ambulatory Visit: Payer: Self-pay

## 2016-09-30 ENCOUNTER — Other Ambulatory Visit: Payer: Self-pay | Admitting: Family Medicine

## 2016-09-30 DIAGNOSIS — R928 Other abnormal and inconclusive findings on diagnostic imaging of breast: Secondary | ICD-10-CM

## 2016-09-30 MED ORDER — HYDROCHLOROTHIAZIDE 25 MG PO TABS: ORAL_TABLET | ORAL | 0 refills | Status: DC

## 2016-09-30 NOTE — Telephone Encounter (Signed)
Pt request refill HCTZ # 15 to [facility] and [facility]; pt is out of med. Pt has CPX scheduled on 10/22/16. Advised pt done as requested and pt voiced understanding.

## 2016-10-05 ENCOUNTER — Telehealth: Payer: Self-pay | Admitting: Family Medicine

## 2016-10-05 DIAGNOSIS — Z0000 Encounter for general adult medical examination without abnormal findings: Secondary | ICD-10-CM

## 2016-10-05 DIAGNOSIS — E7800 Pure hypercholesterolemia, unspecified: Secondary | ICD-10-CM

## 2016-10-05 NOTE — Telephone Encounter (Signed)
-----   Message from Ellamae Sia sent at 10/01/2016 11:34 AM EDT ----- Regarding: Lab orders for Monday, 5.7.18 Patient is scheduled for CPX labs, please order future labs, Thanks , Karna Christmas

## 2016-10-06 ENCOUNTER — Other Ambulatory Visit: Payer: BLUE CROSS/BLUE SHIELD

## 2016-10-08 ENCOUNTER — Ambulatory Visit
Admission: RE | Admit: 2016-10-08 | Discharge: 2016-10-08 | Disposition: A | Discharge status: Home / Self Care | Payer: 59 | Source: Ambulatory Visit | Attending: Family Medicine | Admitting: Family Medicine

## 2016-10-08 DIAGNOSIS — R922 Inconclusive mammogram: Secondary | ICD-10-CM | POA: Diagnosis not present

## 2016-10-08 DIAGNOSIS — R928 Other abnormal and inconclusive findings on diagnostic imaging of breast: Secondary | ICD-10-CM

## 2016-10-13 ENCOUNTER — Other Ambulatory Visit: Payer: BLUE CROSS/BLUE SHIELD

## 2016-10-13 ENCOUNTER — Encounter: Payer: BLUE CROSS/BLUE SHIELD | Admitting: Family Medicine

## 2016-10-14 ENCOUNTER — Encounter: Payer: Self-pay | Admitting: Internal Medicine

## 2016-10-14 ENCOUNTER — Ambulatory Visit (AMBULATORY_SURGERY_CENTER): Payer: Self-pay

## 2016-10-14 VITALS — Ht 67.0 in | Wt 226.2 lb

## 2016-10-14 DIAGNOSIS — Z1211 Encounter for screening for malignant neoplasm of colon: Secondary | ICD-10-CM

## 2016-10-14 NOTE — Progress Notes (Signed)
Denies allergies to eggs or soy products. Denies complication of anesthesia or sedation. Denies use of weight loss medication. Denies use of O2.   Emmi instructions given for colonoscopy.  

## 2016-10-20 ENCOUNTER — Encounter: Payer: BLUE CROSS/BLUE SHIELD | Admitting: Family Medicine

## 2016-10-21 ENCOUNTER — Encounter: Payer: Self-pay | Admitting: Internal Medicine

## 2016-10-21 ENCOUNTER — Ambulatory Visit (AMBULATORY_SURGERY_CENTER): Payer: 59 | Admitting: Internal Medicine

## 2016-10-21 VITALS — BP 113/74 | HR 61 | Temp 96.9°F | Resp 12 | Ht 67.0 in | Wt 226.0 lb

## 2016-10-21 DIAGNOSIS — K635 Polyp of colon: Secondary | ICD-10-CM

## 2016-10-21 DIAGNOSIS — Z1212 Encounter for screening for malignant neoplasm of rectum: Secondary | ICD-10-CM | POA: Diagnosis not present

## 2016-10-21 DIAGNOSIS — D126 Benign neoplasm of colon, unspecified: Secondary | ICD-10-CM

## 2016-10-21 DIAGNOSIS — Z1211 Encounter for screening for malignant neoplasm of colon: Secondary | ICD-10-CM | POA: Diagnosis not present

## 2016-10-21 DIAGNOSIS — D122 Benign neoplasm of ascending colon: Secondary | ICD-10-CM | POA: Diagnosis not present

## 2016-10-21 DIAGNOSIS — D120 Benign neoplasm of cecum: Secondary | ICD-10-CM

## 2016-10-21 MED ORDER — SODIUM CHLORIDE 0.9 % IV SOLN: 500.0000 mL | INTRAVENOUS | Status: AC

## 2016-10-21 NOTE — Patient Instructions (Addendum)
   I found and removed 3 tiny polyps today - all look benign.  You also have a condition called diverticulosis - common and not usually a problem. Please read the handout provided.  I appreciate the opportunity to care for you. Gatha Mayer, MD, FACG  YOU HAD AN ENDOSCOPIC PROCEDURE TODAY AT [facility]:   Refer to the procedure report that was given to you for any specific questions about what was found during the examination.  If the procedure report does not answer your questions, please call your gastroenterologist to clarify.  If you requested that your care partner not be given the details of your procedure findings, then the procedure report has been included in a sealed envelope for you to review at your convenience later.  YOU SHOULD EXPECT: Some feelings of bloating in the abdomen. Passage of more gas than usual.  Walking can help get rid of the air that was put into your GI tract during the procedure and reduce the bloating. If you had a lower endoscopy (such as a colonoscopy or flexible sigmoidoscopy) you may notice spotting of blood in your stool or on the toilet paper. If you underwent a bowel prep for your procedure, you may not have a normal bowel movement for a few days.  Please Note:  You might notice some irritation and congestion in your nose or some drainage.  This is from the oxygen used during your procedure.  There is no need for concern and it should clear up in a day or so.  SYMPTOMS TO REPORT IMMEDIATELY:   Following lower endoscopy (colonoscopy or flexible sigmoidoscopy):  Excessive amounts of blood in the stool  Significant tenderness or worsening of abdominal pains  Swelling of the abdomen that is new, acute  Fever of 100F or higher For urgent or emergent issues, a gastroenterologist can be reached at any hour by calling [phone].   DIET:  We do recommend a small meal at first, but then you may proceed to your regular diet.  Drink  plenty of fluids but you should avoid alcoholic beverages for 24 hours.  ACTIVITY:  You should plan to take it easy for the rest of today and you should NOT DRIVE or use heavy machinery until tomorrow (because of the sedation medicines used during the test).    FOLLOW UP: Our staff will call the number listed on your records the next business day following your procedure to check on you and address any questions or concerns that you may have regarding the information given to you following your procedure. If we do not reach you, we will leave a message.  However, if you are feeling well and you are not experiencing any problems, there is no need to return our call.  We will assume that you have returned to your regular daily activities without incident.  If any biopsies were taken you will be contacted by phone or by letter within the next 1-3 weeks.  Please call us at [phone] if you have not heard about the biopsies in 3 weeks.    SIGNATURES/CONFIDENTIALITY: You and/or your care partner have signed paperwork which will be entered into your electronic medical record.  These signatures attest to the fact that that the information above on your After Visit Summary has been reviewed and is understood.  Full responsibility of the confidentiality of this discharge information lies with you and/or your care-partner.

## 2016-10-21 NOTE — Progress Notes (Signed)
Called to room to assist during endoscopic procedure.  Patient ID and intended procedure confirmed with present staff. Received instructions for my participation in the procedure from the performing physician.  

## 2016-10-21 NOTE — Op Note (Signed)
[facility] Patient Name: Carla Thomas Procedure Date: 10/21/2016 10:44 AM MRN: [id number] Endoscopist: Gatha Mayer , MD Age: 51 Referring MD:  Date of Birth: Dec 16, 1965 Gender: Female Account #: [id number] Procedure:                Colonoscopy Indications:              Screening for colorectal malignant neoplasm, This                            is the patient's first colonoscopy Medicines:                Propofol per Anesthesia, Monitored Anesthesia Care Procedure:                Pre-Anesthesia Assessment:                           - Prior to the procedure, a History and Physical                            was performed, and patient medications and                            allergies were reviewed. The patient's tolerance of                            previous anesthesia was also reviewed. The risks                            and benefits of the procedure and the sedation                            options and risks were discussed with the patient.                            All questions were answered, and informed consent                            was obtained. Prior Anticoagulants: The patient has                            taken no previous anticoagulant or antiplatelet                            agents. ASA Grade Assessment: II - A patient with                            mild systemic disease. After reviewing the risks                            and benefits, the patient was deemed in                            satisfactory condition to undergo the procedure.  After obtaining informed consent, the colonoscope                            was passed under direct vision. Throughout the                            procedure, the patient's blood pressure, pulse, and                            oxygen saturations were monitored continuously. The                            Colonoscope was introduced through the anus and    advanced to the the cecum, identified by                            appendiceal orifice and ileocecal valve. The                            colonoscopy was performed without difficulty. The                            patient tolerated the procedure well. The quality                            of the bowel preparation was excellent. The bowel                            preparation used was Miralax. The ileocecal valve,                            appendiceal orifice, and rectum were photographed. Scope In: 11:02:04 AM Scope Out: 11:17:34 AM Scope Withdrawal Time: 0 hours 12 minutes 8 seconds  Total Procedure Duration: 0 hours 15 minutes 30 seconds  Findings:                 The perianal and digital rectal examinations were                            normal.                           Three sessile polyps were found in the ascending                            colon and cecum. The polyps were diminutive in                            size. These polyps were removed with a cold biopsy                            forceps. Resection and retrieval were complete.                            Verification of  patient identification for the                            specimen was done. Estimated blood loss was minimal.                           Multiple small and large-mouthed diverticula were                            found in the sigmoid colon.                           The exam was otherwise without abnormality on                            direct and retroflexion views. Complications:            No immediate complications. Estimated Blood Loss:     Estimated blood loss was minimal. Impression:               - Three diminutive polyps in the ascending colon                            and in the cecum, removed with a cold biopsy                            forceps. Resected and retrieved.                           - Diverticulosis in the sigmoid colon.                           - The examination was  otherwise normal on direct                            and retroflexion views. Recommendation:           - Patient has a contact number available for                            emergencies. The signs and symptoms of potential                            delayed complications were discussed with the                            patient. Return to normal activities tomorrow.                            Written discharge instructions were provided to the                            patient.                           - Resume previous diet.                           -  Continue present medications.                           - Repeat colonoscopy is recommended. The                            colonoscopy date will be determined after pathology                            results from today's exam become available for                            review. Gatha Mayer, MD 10/21/2016 11:25:39 AM This report has been signed electronically.

## 2016-10-22 ENCOUNTER — Telehealth: Payer: Self-pay

## 2016-10-22 ENCOUNTER — Encounter: Payer: Self-pay | Admitting: Family Medicine

## 2016-10-22 ENCOUNTER — Telehealth: Payer: Self-pay | Admitting: *Deleted

## 2016-10-22 ENCOUNTER — Ambulatory Visit (INDEPENDENT_AMBULATORY_CARE_PROVIDER_SITE_OTHER): Payer: 59 | Admitting: Family Medicine

## 2016-10-22 VITALS — BP 128/78 | HR 67 | Temp 98.2°F | Ht 66.75 in | Wt 223.8 lb

## 2016-10-22 DIAGNOSIS — I10 Essential (primary) hypertension: Secondary | ICD-10-CM | POA: Diagnosis not present

## 2016-10-22 DIAGNOSIS — E0500 Thyrotoxicosis with diffuse goiter without thyrotoxic crisis or storm: Secondary | ICD-10-CM | POA: Diagnosis not present

## 2016-10-22 DIAGNOSIS — E7800 Pure hypercholesterolemia, unspecified: Secondary | ICD-10-CM

## 2016-10-22 DIAGNOSIS — Z0000 Encounter for general adult medical examination without abnormal findings: Secondary | ICD-10-CM

## 2016-10-22 DIAGNOSIS — B20 Human immunodeficiency virus [HIV] disease: Secondary | ICD-10-CM

## 2016-10-22 DIAGNOSIS — L309 Dermatitis, unspecified: Secondary | ICD-10-CM

## 2016-10-22 LAB — COMPREHENSIVE METABOLIC PANEL
ALBUMIN: 4.6 g/dL (ref 3.5–5.2)
ALK PHOS: 52 U/L (ref 39–117)
ALT: 16 U/L (ref 0–35)
AST: 18 U/L (ref 0–37)
BILIRUBIN TOTAL: 0.2 mg/dL (ref 0.2–1.2)
BUN: 13 mg/dL (ref 6–23)
CO2: 28 mEq/L (ref 19–32)
Calcium: 9.5 mg/dL (ref 8.4–10.5)
Chloride: 104 mEq/L (ref 96–112)
Creatinine, Ser: 0.78 mg/dL (ref 0.40–1.20)
GFR: 100.24 mL/min (ref >60.00)
Glucose, Bld: 83 mg/dL (ref 70–99)
POTASSIUM: 3.4 meq/L — AB (ref 3.5–5.1)
SODIUM: 138 meq/L (ref 135–145)
TOTAL PROTEIN: 8 g/dL (ref 6.0–8.3)

## 2016-10-22 LAB — CBC WITH DIFFERENTIAL/PLATELET
BASOS ABS: 0 10*3/uL (ref 0.0–0.1)
Basophils Relative: 0.4 % (ref 0.0–3.0)
Eosinophils Absolute: 0.2 10*3/uL (ref 0.0–0.7)
Eosinophils Relative: 2.5 % (ref 0.0–5.0)
HCT: 40.8 % (ref 36.0–46.0)
Hemoglobin: 13.9 g/dL (ref 12.0–15.0)
LYMPHS ABS: 1.9 10*3/uL (ref 0.7–4.0)
Lymphocytes Relative: 32.6 % (ref 12.0–46.0)
MCHC: 34 g/dL (ref 30.0–36.0)
MCV: 93.3 fl (ref 78.0–100.0)
Monocytes Absolute: 0.4 10*3/uL (ref 0.1–1.0)
Monocytes Relative: 7.5 % (ref 3.0–12.0)
NEUTROS PCT: 57 % (ref 43.0–77.0)
Neutro Abs: 3.4 10*3/uL (ref 1.4–7.7)
Platelets: 277 10*3/uL (ref 150.0–400.0)
RBC: 4.37 Mil/uL (ref 3.87–5.11)
RDW: 13.4 % (ref 11.5–15.5)
WBC: 6 10*3/uL (ref 4.0–10.5)

## 2016-10-22 LAB — LIPID PANEL
Cholesterol: 201 mg/dL — ABNORMAL HIGH (ref 0–200)
HDL: 49.9 mg/dL (ref >39.00)
LDL Cholesterol: 131 mg/dL — ABNORMAL HIGH (ref 0–99)
NONHDL: 151.34
Total CHOL/HDL Ratio: 4
Triglycerides: 103 mg/dL (ref 0.0–149.0)
VLDL: 20.6 mg/dL (ref 0.0–40.0)

## 2016-10-22 LAB — TSH: TSH: 3.26 u[IU]/mL (ref 0.35–4.50)

## 2016-10-22 MED ORDER — HYDROCHLOROTHIAZIDE 25 MG PO TABS: ORAL_TABLET | ORAL | 3 refills | Status: DC

## 2016-10-22 MED ORDER — CLOBETASOL PROPIONATE 0.05 % EX OINT: 1.0000 "application " | TOPICAL_OINTMENT | Freq: Two times a day (BID) | CUTANEOUS | 3 refills | Status: DC

## 2016-10-22 NOTE — Patient Instructions (Addendum)
Labs today  Take care of yourself  Eat a healthy diet / try to exercise 30 or more minutes per day  I sent your px in to mail order

## 2016-10-22 NOTE — Progress Notes (Signed)
Subjective:    Patient ID: Carla Thomas, female    DOB: [birth year], 51 y.o.   MRN: [id number]  HPI  Here for health maintenance exam and to review chronic medical problems    Doing wonderfully !  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    [employer] from Last 3 Encounters:  10/22/16 223 lb 12 oz (101.5 kg)  10/21/16 226 lb (102.5 kg)  10/14/16 226 lb 3.2 oz (102.6 kg)  taking good care of herself overall Did gain some weight  Does get fair exercise /working a lot and likes to be outdoors  Has a gym membership  bmi 35.3  Pap 2/15 - neg with neg HPV  Has had a hysterectomy No symptoms or problems   Mammogram 5/18 normal - had a call back  Self breast exam - no lumps   Tetanus shot 2/16  Colonoscopy yesterday 3 small polyps   HIV - viral load is undetectable  Continues to see ID Doing well with odefsey   Has hx of grave's dz with goiter Lab Results  Component Value Date   TSH 4.01 10/04/2015   due for TSH  Hx of hyperlipidemia Due for labs Lab Results  Component Value Date   CHOL 174 10/04/2015   HDL 42.50 10/04/2015   LDLCALC 116 (H) 10/04/2015   LDLDIRECT 154.6 12/16/2010   TRIG 75.0 10/04/2015   CHOLHDL 4 10/04/2015   bp is stable today  No cp or palpitations or headaches or edema  No side effects to medicines  BP Readings from Last 3 Encounters:  10/22/16 128/78  10/21/16 113/74  10/10/15 120/76     Patient Active Problem List   Diagnosis Date Noted  . Colon cancer screening 08/18/2016  . Screening examination for venereal disease 11/30/2013  . Encounter for routine gynecological examination 08/01/2013  . Rash and nonspecific skin eruption 07/06/2013  . Former smoker 06/02/2011  . Lump in neck 02/26/2011  . HIV disease ([place]) 01/05/2011  . Other screening mammogram 11/12/2010  . Hypokalemia 11/07/2010  . Routine general medical examination at a health care facility 11/04/2010  . HYPERCHOLESTEROLEMIA 06/12/2010  . DYSPEPSIA  07/18/2009  . GOITER 10/16/2006  . Graves disease 10/16/2006  . GOUT 10/16/2006  . Anxiety state 10/16/2006  . Essential hypertension 10/16/2006  . Allergic rhinitis 10/16/2006  . ASTHMA 10/16/2006  . Eczema 10/16/2006  . PLANTAR FASCIITIS 10/16/2006   Past Medical History:  Diagnosis Date  . Anemia    iron deficiency  . Anxiety    Patient denies  . Asthma   . Dehydration   . Gout   . HIV (human immunodeficiency virus infection) ([place])   . Hypertension   . Potassium (K) deficiency   . Thyroid disease    Past Surgical History:  Procedure Laterality Date  . ABDOMINAL HYSTERECTOMY  04/2006   partial for fibroids  . CESAREAN SECTION     Social History  Substance Use Topics  . Smoking status: Former Smoker    Types: Cigarettes  . Smokeless tobacco: Never Used     Comment: stop smoking in Sept  . Alcohol use No     Comment: occ   Family History  Problem Relation Age of Onset  . Cancer Brother        lung CA  . Cancer Maternal Grandmother        breast CA  . Heart failure Maternal Grandmother   . Colon cancer Neg Hx   .  Esophageal cancer Neg Hx   . Rectal cancer Neg Hx   . Stomach cancer Neg Hx    Allergies  Allergen Reactions  . Latex Anaphylaxis  . Codeine     REACTION: headaches  . Shellfish Allergy     Irritates gout  . Varenicline Tartrate     Bad dreams  . Vitamin E     Hair loss  . Amoxicillin Rash    Has patient had a PCN reaction causing immediate rash, facial/tongue/throat swelling, SOB or lightheadedness with hypotension: No Has patient had a PCN reaction causing severe rash involving mucus membranes or skin necrosis: No Has patient had a PCN reaction that required hospitalization No Has patient had a PCN reaction occurring within the last 10 years: yes.Marland Kitchen5-6 years ago If all of the above answers are "NO", then may proceed with Cephalosporin use.     Current Outpatient Prescriptions on File Prior to Visit  Medication Sig Dispense Refill  .  acetaminophen (TYLENOL) 500 MG tablet Take 1,000 mg by mouth every 6 (six) hours as needed for pain.    . calcium carbonate (OS-CAL - DOSED IN MG OF ELEMENTAL CALCIUM) 1250 MG tablet Take 1 tablet by mouth 2 (two) times daily.     . cholecalciferol (VITAMIN D) 400 UNITS TABS Take 400 Units by mouth daily.      Marland Kitchen emtricitabine-rilpivir-tenofovir AF (ODEFSEY) 200-25-25 MG TABS tablet Take 1 tablet by mouth daily with breakfast. 90 tablet 3   Current Facility-Administered Medications on File Prior to Visit  Medication Dose Route Frequency Provider Last Rate Last Dose  . 0.9 %  sodium chloride infusion  500 mL Intravenous Continuous Gatha Mayer, MD         Review of Systems Review of Systems  Constitutional: Negative for fever, appetite change, fatigue and unexpected weight change.  Eyes: Negative for pain and visual disturbance.  Respiratory: Negative for cough and shortness of breath.   Cardiovascular: Negative for cp or palpitations    Gastrointestinal: Negative for nausea, diarrhea and constipation.  Genitourinary: Negative for urgency and frequency.  Skin: Negative for pallor or rash   Neurological: Negative for weakness, light-headedness, numbness and headaches.  Hematological: Negative for adenopathy. Does not bruise/bleed easily.  Psychiatric/Behavioral: Negative for dysphoric mood. The patient is not nervous/anxious.         Objective:   Physical Exam  Constitutional: She appears well-developed and well-nourished. No distress.  obese and well appearing   HENT:  Head: Normocephalic and atraumatic.  Right Ear: External ear normal.  Left Ear: External ear normal.  Mouth/Throat: Oropharynx is clear and moist.  Eyes: Conjunctivae and EOM are normal. Pupils are equal, round, and reactive to light. No scleral icterus.  Neck: Normal range of motion. Neck supple. No JVD present. Carotid bruit is not present. Thyromegaly present.  Cardiovascular: Normal rate, regular rhythm, normal  heart sounds and intact distal pulses.  Exam reveals no gallop.   Pulmonary/Chest: Effort normal and breath sounds normal. No respiratory distress. She has no wheezes. She exhibits no tenderness.  Abdominal: Soft. Bowel sounds are normal. She exhibits no distension, no abdominal bruit and no mass. There is no tenderness.  Eczema on legs   Genitourinary: No breast swelling, tenderness, discharge or bleeding.  Musculoskeletal: Normal range of motion. She exhibits no edema or tenderness.  Lymphadenopathy:    She has no cervical adenopathy.  Neurological: She is alert. She has normal reflexes. No cranial nerve deficit. She exhibits normal muscle tone. Coordination normal.  Skin: Skin is warm and dry. No rash noted. No erythema. No pallor.  Psychiatric: She has a normal mood and affect.          Assessment & Plan:   Problem List Items Addressed This Visit      Cardiovascular and Mediastinum   Essential hypertension - Primary    bp in fair control at this time  BP Readings from Last 1 Encounters:  10/22/16 128/78   No changes needed Disc lifstyle change with low sodium diet and exercise  Labs drawn today      Relevant Medications   hydrochlorothiazide (HYDRODIURIL) 25 MG tablet     Endocrine   Graves disease    No clinical changes TSH today        Musculoskeletal and Integument   Eczema    Refilled temovate Adv to avoid hot water and keep moisturizing        Other   HIV disease ([place])    Doing well under care of ID       HYPERCHOLESTEROLEMIA    Lipid panel today Disc goals for lipids and reasons to control them Rev labs with pt  (last check) Rev low sat fat diet in detail d      Relevant Medications   hydrochlorothiazide (HYDRODIURIL) 25 MG tablet   Routine general medical examination at a health care facility    Reviewed health habits including diet and exercise and skin cancer prevention Reviewed appropriate screening tests for age  Also reviewed health  mt list, fam hx and immunization status , as well as social and family history   See HPI Labs drawn today  Enc healthy diet and exercise  utd imms and screening  Can disc Shingrix with her ID specialist

## 2016-10-22 NOTE — Telephone Encounter (Signed)
Left message on answering machine. 

## 2016-10-22 NOTE — Telephone Encounter (Signed)
  Follow up Call-  Call back number 10/21/2016  Post procedure Call Back phone  # [phone]  Permission to leave phone message No  Some recent data might be hidden     Patient questions:  Do you have a fever, pain , or abdominal swelling? No. Pain Score  0 *  Have you tolerated food without any problems? Yes.    Have you been able to return to your normal activities? Yes.    Do you have any questions about your discharge instructions: Diet   No. Medications  No. Follow up visit  No.  Do you have questions or concerns about your Care? No.  Actions: * If pain score is 4 or above: No action needed, pain <4.

## 2016-10-23 NOTE — Assessment & Plan Note (Signed)
bp in fair control at this time  BP Readings from Last 1 Encounters:  10/22/16 128/78   No changes needed Disc lifstyle change with low sodium diet and exercise  Labs drawn today

## 2016-10-23 NOTE — Assessment & Plan Note (Signed)
No changes  TSH drawn today

## 2016-10-23 NOTE — Assessment & Plan Note (Signed)
No clinical changes TSH today 

## 2016-10-23 NOTE — Assessment & Plan Note (Signed)
Lipid panel today Disc goals for lipids and reasons to control them Rev labs with pt  (last check) Rev low sat fat diet in detail d

## 2016-10-23 NOTE — Assessment & Plan Note (Signed)
Refilled temovate Adv to avoid hot water and keep moisturizing

## 2016-10-23 NOTE — Assessment & Plan Note (Signed)
Doing well under care of ID

## 2016-10-23 NOTE — Assessment & Plan Note (Signed)
Reviewed health habits including diet and exercise and skin cancer prevention Reviewed appropriate screening tests for age  Also reviewed health mt list, fam hx and immunization status , as well as social and family history   See HPI Labs drawn today  Enc healthy diet and exercise  utd imms and screening  Can disc Shingrix with her ID specialist

## 2016-10-30 ENCOUNTER — Encounter: Payer: Self-pay | Admitting: Internal Medicine

## 2016-10-30 DIAGNOSIS — Z860101 Personal history of adenomatous and serrated colon polyps: Secondary | ICD-10-CM | POA: Insufficient documentation

## 2016-10-30 DIAGNOSIS — Z86010 Personal history of colonic polyps: Secondary | ICD-10-CM

## 2016-10-30 HISTORY — DX: Personal history of colonic polyps: Z86.010

## 2016-10-30 HISTORY — DX: Personal history of adenomatous and serrated colon polyps: Z86.0101

## 2016-10-30 NOTE — Progress Notes (Signed)
1 of 3 polyps adenoma, others lymphoid Recall colonoscopy 2023 My Chart Letter

## 2016-12-30 ENCOUNTER — Other Ambulatory Visit: Payer: 59

## 2016-12-30 ENCOUNTER — Other Ambulatory Visit: Payer: Self-pay | Admitting: *Deleted

## 2016-12-30 DIAGNOSIS — Z113 Encounter for screening for infections with a predominantly sexual mode of transmission: Secondary | ICD-10-CM

## 2016-12-30 DIAGNOSIS — B20 Human immunodeficiency virus [HIV] disease: Secondary | ICD-10-CM

## 2016-12-30 DIAGNOSIS — Z79899 Other long term (current) drug therapy: Secondary | ICD-10-CM

## 2016-12-30 LAB — COMPLETE METABOLIC PANEL WITH GFR
ALBUMIN: 4 g/dL (ref 3.6–5.1)
ALK PHOS: 48 U/L (ref 33–130)
ALT: 12 U/L (ref 6–29)
AST: 17 U/L (ref 10–35)
BILIRUBIN TOTAL: 0.5 mg/dL (ref 0.2–1.2)
BUN: 13 mg/dL (ref 7–25)
CALCIUM: 9.2 mg/dL (ref 8.6–10.4)
CO2: 30 mmol/L (ref 20–31)
CREATININE: 0.8 mg/dL (ref 0.50–1.05)
Chloride: 104 mmol/L (ref 98–110)
GFR, Est African American: >89 mL/min (ref >=60)
GFR, Est Non African American: 86 mL/min (ref >=60)
Glucose, Bld: 89 mg/dL (ref 65–99)
Potassium: 4.2 mmol/L (ref 3.5–5.3)
Sodium: 140 mmol/L (ref 135–146)
TOTAL PROTEIN: 6.9 g/dL (ref 6.1–8.1)

## 2016-12-30 LAB — CBC WITH DIFFERENTIAL/PLATELET
Basophils Absolute: 0 cells/uL (ref 0–200)
Basophils Relative: 0 %
EOS PCT: 2 %
Eosinophils Absolute: 102 cells/uL (ref 15–500)
HCT: 39.6 % (ref 35.0–45.0)
Hemoglobin: 13.3 g/dL (ref 11.7–15.5)
LYMPHS PCT: 28 %
Lymphs Abs: 1428 cells/uL (ref 850–3900)
MCH: 31.4 pg (ref 27.0–33.0)
MCHC: 33.6 g/dL (ref 32.0–36.0)
MCV: 93.4 fL (ref 80.0–100.0)
MPV: 10.8 fL (ref 7.5–12.5)
Monocytes Absolute: 357 cells/uL (ref 200–950)
Monocytes Relative: 7 %
NEUTROS PCT: 63 %
Neutro Abs: 3213 cells/uL (ref 1500–7800)
Platelets: 278 10*3/uL (ref 140–400)
RBC: 4.24 MIL/uL (ref 3.80–5.10)
RDW: 13.3 % (ref 11.0–15.0)
WBC: 5.1 10*3/uL (ref 3.8–10.8)

## 2016-12-30 LAB — LIPID PANEL
CHOLESTEROL: 181 mg/dL (ref <200)
HDL: 47 mg/dL — ABNORMAL LOW (ref >50)
LDL Cholesterol: 118 mg/dL — ABNORMAL HIGH (ref <100)
TRIGLYCERIDES: 78 mg/dL (ref <150)
Total CHOL/HDL Ratio: 3.9 Ratio (ref <5.0)
VLDL: 16 mg/dL (ref <30)

## 2016-12-30 MED ORDER — EMTRICITAB-RILPIVIR-TENOFOV AF 200-25-25 MG PO TABS: 1.0000 | ORAL_TABLET | Freq: Every day | ORAL | 3 refills | Status: DC

## 2016-12-31 LAB — T-HELPER CELL (CD4) - (RCID CLINIC ONLY)
CD4 % Helper T Cell: 48 % (ref 33–55)
CD4 T Cell Abs: 690 /uL (ref 400–2700)

## 2016-12-31 LAB — RPR

## 2017-01-02 LAB — HIV-1 RNA QUANT-NO REFLEX-BLD
HIV 1 RNA QUANT: NOT DETECTED {copies}/mL
HIV-1 RNA QUANT, LOG: NOT DETECTED {Log_copies}/mL

## 2017-01-22 ENCOUNTER — Encounter: Payer: Self-pay | Admitting: Internal Medicine

## 2017-01-22 ENCOUNTER — Ambulatory Visit (INDEPENDENT_AMBULATORY_CARE_PROVIDER_SITE_OTHER): Payer: 59 | Admitting: Internal Medicine

## 2017-01-22 ENCOUNTER — Other Ambulatory Visit: Payer: Self-pay | Admitting: *Deleted

## 2017-01-22 VITALS — BP 138/88 | HR 69 | Temp 98.4°F | Ht 66.5 in | Wt 225.0 lb

## 2017-01-22 DIAGNOSIS — B20 Human immunodeficiency virus [HIV] disease: Secondary | ICD-10-CM | POA: Diagnosis not present

## 2017-01-22 DIAGNOSIS — Z5181 Encounter for therapeutic drug level monitoring: Secondary | ICD-10-CM

## 2017-01-22 DIAGNOSIS — Z113 Encounter for screening for infections with a predominantly sexual mode of transmission: Secondary | ICD-10-CM

## 2017-01-22 MED ORDER — EMTRICITAB-RILPIVIR-TENOFOV AF 200-25-25 MG PO TABS: 1.0000 | ORAL_TABLET | Freq: Every day | ORAL | 3 refills | Status: DC

## 2017-01-22 NOTE — Assessment & Plan Note (Signed)
Screened negative 

## 2017-01-22 NOTE — Assessment & Plan Note (Signed)
Creat, LFTs, wnl 

## 2017-01-22 NOTE — Progress Notes (Signed)
CC: Follow up for HIV  Interval history: Currently is asymptomatic and well-controlled on Odefsey.  Has no associated n/v/d.  Denies any missed doses. CD4 690, viral load undetectable.  Taking Vitamin D. Active with gardening.    Prior to Admission medications   Medication Sig Start Date End Date Taking? Authorizing Provider  acetaminophen (TYLENOL) 500 MG tablet Take 1,000 mg by mouth every 6 (six) hours as needed for pain.   Yes Historical Provider, MD  calcium carbonate (OS-CAL - DOSED IN MG OF ELEMENTAL CALCIUM) 1250 MG tablet Take 1 tablet by mouth 2 (two) times daily.    Yes Historical Provider, MD  cholecalciferol (VITAMIN D) 400 UNITS TABS Take 400 Units by mouth daily.     Yes Historical Provider, MD  Emtricitab-Rilpivir-Tenofov DF 200-25-300 MG TABS Take 1 tablet by mouth every evening. 10/11/14  Yes Thayer Headings, MD  fluticasone (FLONASE) 50 MCG/ACT nasal spray Place 2 sprays into both nostrils daily. 02/13/15  Yes Abner Greenspan, MD  hydrochlorothiazide (HYDRODIURIL) 25 MG tablet TAKE 1 TABLET EVERY MORNING 08/07/14  Yes Abner Greenspan, MD    Review of Systems Constitutional: negative for fatigue and malaise Gastrointestinal: negative for diarrhea All other systems reviewed and are negative   Physical Exam: CONSTITUTIONAL:in no apparent distress and alert  Vitals:   01/22/17 1446  BP: 138/88  Pulse: 69  Temp: 98.4 F (36.9 C)   Eyes: anicteric HENT: no thrush, no cervical lymphadenopathy Respiratory: Normal respiratory effort; CTA B Skin: some hyperpigmented areas on right lower leg, no active infection  Lab Results  Component Value Date   HIV1RNAQUANT <20 NOT DETECTED 12/30/2016   HIV1RNAQUANT <20 10/18/2015   HIV1RNAQUANT <20 02/26/2015   No components found for: HIV1GENOTYPRPLUS No components found for: THELPERCELL  SH: no tobacco, former smoker

## 2017-01-22 NOTE — Assessment & Plan Note (Signed)
Doing well, no new issues.  rtc 1year

## 2017-03-09 ENCOUNTER — Encounter: Payer: Self-pay | Admitting: Family Medicine

## 2017-03-09 ENCOUNTER — Ambulatory Visit (INDEPENDENT_AMBULATORY_CARE_PROVIDER_SITE_OTHER): Payer: 59 | Admitting: Family Medicine

## 2017-03-09 VITALS — BP 128/76 | HR 67 | Temp 97.9°F | Ht 66.5 in | Wt 224.2 lb

## 2017-03-09 DIAGNOSIS — J019 Acute sinusitis, unspecified: Secondary | ICD-10-CM | POA: Insufficient documentation

## 2017-03-09 DIAGNOSIS — J0110 Acute frontal sinusitis, unspecified: Secondary | ICD-10-CM | POA: Diagnosis not present

## 2017-03-09 MED ORDER — AZITHROMYCIN 250 MG PO TABS: ORAL_TABLET | ORAL | 0 refills | Status: DC

## 2017-03-09 MED ORDER — FLUTICASONE PROPIONATE 50 MCG/ACT NA SUSP: 2.0000 | Freq: Every day | NASAL | 11 refills | Status: AC

## 2017-03-09 NOTE — Assessment & Plan Note (Addendum)
L side- frontal after 2 wk of congestion  Px zpak (all to pcn) Nasal saline  Stop afrin-disc rebound congestion  Get back on flonase daily  Rest/fluids  Update if not starting to improve in a week or if worsening   Disc ways to resist urge to smoke -she has not smoked at all

## 2017-03-09 NOTE — Patient Instructions (Addendum)
flonase nasal spray- get back on daily  Stop the over the counter (afrin) nasal spray- it is causing rebound congestion  Nasal saline spray is ok anytime  Take the zithromax as directed for the sinus infection   Drink fluids  Get extra rest if needed   Get mints/sugarless gum/ toothpicks / straws to help resist urge to smoke  Come up with a list of alternatives - reading/ call someone/ take a walk   Update if not starting to improve in a week or if worsening

## 2017-03-09 NOTE — Progress Notes (Signed)
Subjective:    Patient ID: Carla Thomas, female    DOB: April 12, 1966, 51 y.o.   MRN: [id number]  HPI Here for sinus symptoms   Ringing in the ears (L worse than R) Sinus pressure above the eyes/ worse on the R No fever  No cough  Nasal congestion- ? What color d/c is  Took sudafed -helpful   Afrin for 2 weeks - ? Rebound congestion   Started in early sept   Craving cigarettes moe - but not smoking  Getting work done to the house   Patient Active Problem List   Diagnosis Date Noted  . Medication monitoring encounter 01/22/2017  . Hx of adenomatous polyp of colon 10/30/2016  . Colon cancer screening 08/18/2016  . Screening examination for venereal disease 11/30/2013  . Encounter for routine gynecological examination 08/01/2013  . Rash and nonspecific skin eruption 07/06/2013  . Former smoker 06/02/2011  . Lump in neck 02/26/2011  . HIV disease ([place]) 01/05/2011  . Other screening mammogram 11/12/2010  . Hypokalemia 11/07/2010  . Routine general medical examination at a health care facility 11/04/2010  . HYPERCHOLESTEROLEMIA 06/12/2010  . DYSPEPSIA 07/18/2009  . GOITER 10/16/2006  . Graves disease 10/16/2006  . GOUT 10/16/2006  . Anxiety state 10/16/2006  . Essential hypertension 10/16/2006  . Allergic rhinitis 10/16/2006  . ASTHMA 10/16/2006  . Eczema 10/16/2006  . PLANTAR FASCIITIS 10/16/2006   Past Medical History:  Diagnosis Date  . Anemia    iron deficiency  . Anxiety    Patient denies  . Asthma   . Dehydration   . Gout   . HIV (human immunodeficiency virus infection) ([place])   . Hx of adenomatous polyp of colon 10/30/2016  . Hypertension   . Potassium (K) deficiency   . Thyroid disease    Past Surgical History:  Procedure Laterality Date  . ABDOMINAL HYSTERECTOMY  04/2006   partial for fibroids  . CESAREAN SECTION     Social History  Substance Use Topics  . Smoking status: Former Smoker    Types: Cigarettes  . Smokeless tobacco: Never Used       Comment: stop smoking in Sept  . Alcohol use No   Family History  Problem Relation Age of Onset  . Cancer Brother        lung CA  . Cancer Maternal Grandmother        breast CA  . Heart failure Maternal Grandmother   . Colon cancer Neg Hx   . Esophageal cancer Neg Hx   . Rectal cancer Neg Hx   . Stomach cancer Neg Hx    Allergies  Allergen Reactions  . Latex Anaphylaxis  . Codeine     REACTION: headaches  . Shellfish Allergy     Irritates gout  . Varenicline Tartrate     Bad dreams  . Vitamin E     Hair loss  . Amoxicillin Rash    Has patient had a PCN reaction causing immediate rash, facial/tongue/throat swelling, SOB or lightheadedness with hypotension: No Has patient had a PCN reaction causing severe rash involving mucus membranes or skin necrosis: No Has patient had a PCN reaction that required hospitalization No Has patient had a PCN reaction occurring within the last 10 years: yes.Marland Kitchen5-6 years ago If all of the above answers are "NO", then may proceed with Cephalosporin use.     Current Outpatient Prescriptions on File Prior to Visit  Medication Sig Dispense Refill  . acetaminophen (TYLENOL) 500 MG tablet  Take 1,000 mg by mouth every 6 (six) hours as needed for pain.    . calcium carbonate (OS-CAL - DOSED IN MG OF ELEMENTAL CALCIUM) 1250 MG tablet Take 1 tablet by mouth 2 (two) times daily.     . cholecalciferol (VITAMIN D) 400 UNITS TABS Take 400 Units by mouth daily.      . clobetasol ointment (TEMOVATE) 8.36 % Apply 1 application topically 2 (two) times daily. To affected area as needed 80 g 3  . emtricitabine-rilpivir-tenofovir AF (ODEFSEY) 200-25-25 MG TABS tablet Take 1 tablet by mouth daily with breakfast. 90 tablet 3  . hydrochlorothiazide (HYDRODIURIL) 25 MG tablet TAKE 1 TABLET EVERY MORNING 90 tablet 3   Current Facility-Administered Medications on File Prior to Visit  Medication Dose Route Frequency Provider Last Rate Last Dose  . 0.9 %  sodium  chloride infusion  500 mL Intravenous Continuous Gatha Mayer, MD          Review of Systems  Constitutional: Positive for fatigue. Negative for appetite change and fever.  HENT: Positive for congestion, ear pain, postnasal drip, rhinorrhea, sinus pressure and sore throat. Negative for nosebleeds.   Eyes: Negative for pain, redness and itching.  Respiratory: Negative for cough, shortness of breath and wheezing.   Cardiovascular: Negative for chest pain.  Gastrointestinal: Negative for abdominal pain, diarrhea, nausea and vomiting.  Endocrine: Negative for polyuria.  Genitourinary: Negative for dysuria, frequency and urgency.  Musculoskeletal: Negative for arthralgias and myalgias.  Allergic/Immunologic: Negative for immunocompromised state.  Neurological: Positive for headaches. Negative for dizziness, tremors, syncope, weakness and numbness.  Hematological: Negative for adenopathy. Does not bruise/bleed easily.  Psychiatric/Behavioral: Negative for dysphoric mood. The patient is not nervous/anxious.        Objective:   Physical Exam  Constitutional: She appears well-developed and well-nourished. No distress.  overwt and well app  HENT:  Head: Normocephalic and atraumatic.  Right Ear: External ear normal.  Left Ear: External ear normal.  Mouth/Throat: Oropharynx is clear and moist. No oropharyngeal exudate.  Nares are injected and congested - worse on the L Bilateral frontal sinus tenderness (mild and worse on the L)  Post nasal drip   Eyes: Pupils are equal, round, and reactive to light. Conjunctivae and EOM are normal. Right eye exhibits no discharge. Left eye exhibits no discharge.  Neck: Normal range of motion. Neck supple.  Cardiovascular: Normal rate and regular rhythm.   Pulmonary/Chest: Effort normal and breath sounds normal. No respiratory distress. She has no wheezes. She has no rales.  Lymphadenopathy:    She has no cervical adenopathy.  Neurological: She is  alert. No cranial nerve deficit.  Skin: Skin is warm and dry. No rash noted.  Psychiatric: She has a normal mood and affect.          Assessment & Plan:   Problem List Items Addressed This Visit      Respiratory   Acute sinusitis    L side- frontal after 2 wk of congestion  Px zpak (all to pcn) Nasal saline  Stop afrin-disc rebound congestion  Get back on flonase daily  Rest/fluids  Update if not starting to improve in a week or if worsening   Disc ways to resist urge to smoke -she has not smoked at all       Relevant Medications   azithromycin (ZITHROMAX Z-PAK) 250 MG tablet   fluticasone (FLONASE) 50 MCG/ACT nasal spray

## 2017-04-06 ENCOUNTER — Telehealth: Payer: Self-pay | Admitting: Family Medicine

## 2017-04-06 NOTE — Telephone Encounter (Signed)
Please get more details from her- unsure what she is referring to  Thanks

## 2017-04-06 NOTE — Telephone Encounter (Signed)
Copied from [place] #[id number]. Topic: Quick Communication - See Telephone Encounter >> Apr 06, 2017  2:03 PM Ether Griffins B wrote: CRM for notification. See Telephone encounter for:  04/06/17. Would like Dr. Glori Bickers to call about go ahead and starting potassium and talk to Dr. Glori Bickers about her thoughts on that.

## 2017-04-07 ENCOUNTER — Encounter: Payer: Self-pay | Admitting: *Deleted

## 2017-04-07 NOTE — Telephone Encounter (Signed)
Left voicemail requesting pt to call back and give more info on why she wants to start potasium

## 2017-04-09 NOTE — Telephone Encounter (Signed)
Awaiting pt to respond via her MyChart as message was sent on 10.30.18 requesting reasoning behind request for K/thx dmf

## 2017-05-29 ENCOUNTER — Ambulatory Visit: Payer: 59 | Admitting: Medical

## 2017-07-17 ENCOUNTER — Ambulatory Visit: Payer: 59 | Admitting: Family Medicine

## 2017-07-17 ENCOUNTER — Encounter: Payer: Self-pay | Admitting: Family Medicine

## 2017-07-17 VITALS — BP 128/86 | HR 65 | Temp 98.0°F | Ht 66.5 in | Wt 226.0 lb

## 2017-07-17 DIAGNOSIS — IMO0001 Reserved for inherently not codable concepts without codable children: Secondary | ICD-10-CM

## 2017-07-17 DIAGNOSIS — B20 Human immunodeficiency virus [HIV] disease: Secondary | ICD-10-CM

## 2017-07-17 DIAGNOSIS — M7711 Lateral epicondylitis, right elbow: Secondary | ICD-10-CM | POA: Diagnosis not present

## 2017-07-17 DIAGNOSIS — F17200 Nicotine dependence, unspecified, uncomplicated: Secondary | ICD-10-CM | POA: Diagnosis not present

## 2017-07-17 DIAGNOSIS — M7710 Lateral epicondylitis, unspecified elbow: Secondary | ICD-10-CM | POA: Insufficient documentation

## 2017-07-17 DIAGNOSIS — M722 Plantar fascial fibromatosis: Secondary | ICD-10-CM | POA: Diagnosis not present

## 2017-07-17 MED ORDER — MELOXICAM 7.5 MG PO TABS: 7.5000 mg | ORAL_TABLET | Freq: Every day | ORAL | 0 refills | Status: AC | PRN

## 2017-07-17 NOTE — Patient Instructions (Addendum)
To help quit smoking- get nicotine gum over the counter (start with this) - chew a piece instead of smoking a cigarette and gradually decrease over time   If not helpful /let me know   Use ice on elbow 10 minutes whenever you can  Let's keep you out of work until Wednesday  Continue the forearm band Try meloxoicam 7.5 mg once daily with food (if it bothers stomach-stop it)   In 2 weeks if arm is not improved - make an appt with Dr Lorelei Pont (our sport med doctor)

## 2017-07-17 NOTE — Progress Notes (Signed)
Subjective:    Patient ID: Carla Thomas, female    DOB: 08-19-1965, 52 y.o.   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 Smoking 07/17/2017  . Lateral epicondylitis 07/17/2017  . Acute sinusitis 03/09/2017  . Medication monitoring encounter 01/22/2017  . Hx of adenomatous polyp of colon 10/30/2016  . Colon cancer screening 08/18/2016  . Screening examination for venereal disease 11/30/2013  . Encounter for routine gynecological examination 08/01/2013  . Rash and nonspecific skin eruption 07/06/2013  . Former smoker 06/02/2011  . Lump in neck 02/26/2011  . HIV disease ([place]) 01/05/2011  . Other screening mammogram 11/12/2010  . Hypokalemia 11/07/2010  . Routine general medical examination at a health care facility 11/04/2010  . HYPERCHOLESTEROLEMIA 06/12/2010  . DYSPEPSIA 07/18/2009  . GOITER 10/16/2006  . Graves disease 10/16/2006  . GOUT 10/16/2006  . Anxiety state 10/16/2006  . Essential hypertension 10/16/2006  . Allergic rhinitis 10/16/2006  . ASTHMA 10/16/2006  .  Eczema 10/16/2006  . PLANTAR FASCIITIS 10/16/2006   Past Medical History:  Diagnosis Date  . Anemia    iron deficiency  . Anxiety    Patient denies  . Asthma   . Dehydration   . Gout   . HIV (human immunodeficiency virus infection) ([place])   . Hx of adenomatous polyp of colon 10/30/2016  . Hypertension   . Potassium (K) deficiency   . Thyroid disease    Past Surgical History:  Procedure Laterality Date  . ABDOMINAL HYSTERECTOMY  04/2006   partial for fibroids  . CESAREAN SECTION     Social History   Tobacco Use  . Smoking status: Former Smoker    Types: Cigarettes  . Smokeless tobacco: Never Used  . Tobacco comment: stop smoking in Sept  Substance Use Topics  . Alcohol use: No    Alcohol/week: 0.0 oz  . Drug use: No   Family History  Problem Relation Age of Onset  . Cancer Brother        lung CA  . Cancer Maternal Grandmother        breast CA  . Heart failure Maternal Grandmother   . Colon cancer Neg Hx   . Esophageal cancer Neg Hx   . Rectal cancer Neg Hx   . Stomach cancer Neg Hx    Allergies  Allergen Reactions  . Latex  Anaphylaxis  . Codeine     REACTION: headaches  . Shellfish Allergy     Irritates gout  . Varenicline Tartrate     Bad dreams  . Vitamin E     Hair loss  . Amoxicillin Rash    Has patient had a PCN reaction causing immediate rash, facial/tongue/throat swelling, SOB or lightheadedness with hypotension: No Has patient had a PCN reaction causing severe rash involving mucus membranes or skin necrosis: No Has patient had a PCN reaction that required hospitalization No Has patient had a PCN reaction occurring within the last 10 years: yes.Marland Kitchen5-6 years ago If all of the above answers are "NO", then may proceed with Cephalosporin use.     Current Outpatient Medications on File Prior to Visit  Medication Sig Dispense Refill  . acetaminophen (TYLENOL) 500 MG tablet Take 1,000 mg by mouth every 6 (six) hours as needed for pain.    . calcium  carbonate (OS-CAL - DOSED IN MG OF ELEMENTAL CALCIUM) 1250 MG tablet Take 1 tablet by mouth 2 (two) times daily.     . cholecalciferol (VITAMIN D) 400 UNITS TABS Take 400 Units by mouth daily.      . clobetasol ointment (TEMOVATE) 1.02 % Apply 1 application topically 2 (two) times daily. To affected area as needed 80 g 3  . emtricitabine-rilpivir-tenofovir AF (ODEFSEY) 200-25-25 MG TABS tablet Take 1 tablet by mouth daily with breakfast. 90 tablet 3  . fluticasone (FLONASE) 50 MCG/ACT nasal spray Place 2 sprays into both nostrils daily. 16 g 11  . hydrochlorothiazide (HYDRODIURIL) 25 MG tablet TAKE 1 TABLET EVERY MORNING 90 tablet 3   Current Facility-Administered Medications on File Prior to Visit  Medication Dose Route Frequency Provider Last Rate Last Dose  . 0.9 %  sodium chloride infusion  500 mL Intravenous Continuous Gatha Mayer, MD         Review of Systems  Constitutional: Negative for activity change, appetite change, fatigue, fever and unexpected weight change.  HENT: Negative for congestion, ear pain, rhinorrhea, sinus pressure and sore throat.   Eyes: Negative for pain, redness and visual disturbance.  Respiratory: Negative for cough, shortness of breath and wheezing.   Cardiovascular: Negative for chest pain and palpitations.  Gastrointestinal: Negative for abdominal pain, blood in stool, constipation and diarrhea.  Endocrine: Negative for polydipsia and polyuria.  Genitourinary: Negative for dysuria, frequency and urgency.  Musculoskeletal: Negative for arthralgias, back pain and myalgias.       Pos for R arm and elbow pain   Skin: Negative for pallor and rash.  Allergic/Immunologic: Negative for environmental allergies.  Neurological: Negative for dizziness, syncope and headaches.  Hematological: Negative for adenopathy. Does not bruise/bleed easily.  Psychiatric/Behavioral: Negative for decreased concentration and dysphoric mood. The patient is not nervous/anxious.            Objective:   Physical Exam  Constitutional: She appears well-developed and well-nourished. No distress.  overwt and well appearing   HENT:  Head: Normocephalic and atraumatic.  Eyes: Conjunctivae and EOM are normal. Pupils are equal, round, and reactive to light. No scleral icterus.  Neck: Normal range of motion. Neck supple.  Cardiovascular: Normal rate, regular rhythm and normal heart sounds.  Pulmonary/Chest: Effort normal and breath sounds normal. No respiratory distress. She has no wheezes.  Musculoskeletal: She exhibits tenderness. She exhibits no edema or deformity.       Right elbow: She exhibits normal range of motion, no swelling and no effusion. Tenderness found. Lateral  epicondyle tenderness noted. No olecranon process tenderness noted.  RUE Tender over lateral epicondyle Nl rom of elbow and wrist and hand  No swelling  Nl grip  Some pain with full flexion of wrist  Also pronation of arm   Lymphadenopathy:    She has no cervical adenopathy.  Neurological: She is alert. She has normal reflexes. She displays no atrophy and no tremor. No sensory deficit. She exhibits normal muscle tone.  Skin: Skin is warm and dry. No rash noted. No erythema. No pallor.  Psychiatric: She has a normal mood and affect.          Assessment & Plan:   Problem List Items Addressed This Visit      Musculoskeletal and Integument   Lateral epicondylitis - Primary    This may be recurrent from overuse /reped use  rec relative rest Ice when able Forearm band 2 wk trial of low dose meloxicam (watch for GI upset) If not improved f/u with Dr Lorelei Pont        Relevant Medications   meloxicam (MOBIC) 7.5 MG tablet   PLANTAR FASCIITIS    Low dose meloxicam may help this as well as epicondylitis  Ice prn Hard soled shoes        Other   HIV disease ([place])    Doing well with current medications Continue ID f/u      Smoking    She is back to light smoking again (1/2 pp  week)  Recommend 1 pc of nicotine gum instead of cigarette when craving  Alert [place] if not effective  May consider chantix if necessary

## 2017-07-19 ENCOUNTER — Encounter: Payer: Self-pay | Admitting: Family Medicine

## 2017-07-19 NOTE — Assessment & Plan Note (Signed)
Low dose meloxicam may help this as well as epicondylitis  Ice prn Hard soled shoes

## 2017-07-19 NOTE — Assessment & Plan Note (Signed)
This may be recurrent from overuse /reped use  rec relative rest Ice when able Forearm band 2 wk trial of low dose meloxicam (watch for GI upset) If not improved f/u with Dr Lorelei Pont

## 2017-07-19 NOTE — Assessment & Plan Note (Signed)
She is back to light smoking again (1/2 pp week)  Recommend 1 pc of nicotine gum instead of cigarette when craving  Alert [place] if not effective  May consider chantix if necessary

## 2017-07-19 NOTE — Assessment & Plan Note (Signed)
Doing well with current medications Continue ID f/u

## 2017-09-03 HISTORY — PX: DENTAL SURGERY: SHX609

## 2017-10-15 ENCOUNTER — Other Ambulatory Visit: Payer: Self-pay | Admitting: Family Medicine

## 2017-10-19 ENCOUNTER — Telehealth: Payer: Self-pay | Admitting: Family Medicine

## 2017-10-19 DIAGNOSIS — I10 Essential (primary) hypertension: Secondary | ICD-10-CM

## 2017-10-19 DIAGNOSIS — E7800 Pure hypercholesterolemia, unspecified: Secondary | ICD-10-CM

## 2017-10-19 NOTE — Telephone Encounter (Signed)
-----   Message from Ellamae Sia sent at 10/13/2017 12:25 PM EDT ----- Regarding: Lab orders for Friday, 5.17.19 Patient is scheduled for CPX labs, please order future labs, Thanks , Karna Christmas

## 2017-10-23 ENCOUNTER — Other Ambulatory Visit (INDEPENDENT_AMBULATORY_CARE_PROVIDER_SITE_OTHER): Payer: 59

## 2017-10-23 DIAGNOSIS — E7800 Pure hypercholesterolemia, unspecified: Secondary | ICD-10-CM

## 2017-10-23 DIAGNOSIS — I10 Essential (primary) hypertension: Secondary | ICD-10-CM | POA: Diagnosis not present

## 2017-10-23 LAB — LIPID PANEL
Cholesterol: 175 mg/dL (ref 0–200)
HDL: 47.6 mg/dL (ref >39.00)
LDL CALC: 114 mg/dL — AB (ref 0–99)
NONHDL: 127.05
Total CHOL/HDL Ratio: 4
Triglycerides: 67 mg/dL (ref 0.0–149.0)
VLDL: 13.4 mg/dL (ref 0.0–40.0)

## 2017-10-23 LAB — CBC WITH DIFFERENTIAL/PLATELET
BASOS PCT: 0.5 % (ref 0.0–3.0)
Basophils Absolute: 0 10*3/uL (ref 0.0–0.1)
EOS PCT: 3.4 % (ref 0.0–5.0)
Eosinophils Absolute: 0.1 10*3/uL (ref 0.0–0.7)
HCT: 38.6 % (ref 36.0–46.0)
HEMOGLOBIN: 13.1 g/dL (ref 12.0–15.0)
Lymphocytes Relative: 31.3 % (ref 12.0–46.0)
Lymphs Abs: 1.4 10*3/uL (ref 0.7–4.0)
MCHC: 33.9 g/dL (ref 30.0–36.0)
MCV: 94.9 fl (ref 78.0–100.0)
MONOS PCT: 7.2 % (ref 3.0–12.0)
Monocytes Absolute: 0.3 10*3/uL (ref 0.1–1.0)
Neutro Abs: 2.5 10*3/uL (ref 1.4–7.7)
Neutrophils Relative %: 57.6 % (ref 43.0–77.0)
Platelets: 244 10*3/uL (ref 150.0–400.0)
RBC: 4.06 Mil/uL (ref 3.87–5.11)
RDW: 13.6 % (ref 11.5–15.5)
WBC: 4.3 10*3/uL (ref 4.0–10.5)

## 2017-10-23 LAB — COMPREHENSIVE METABOLIC PANEL
ALBUMIN: 4.3 g/dL (ref 3.5–5.2)
ALT: 19 U/L (ref 0–35)
AST: 18 U/L (ref 0–37)
Alkaline Phosphatase: 45 U/L (ref 39–117)
BUN: 13 mg/dL (ref 6–23)
CHLORIDE: 102 meq/L (ref 96–112)
CO2: 32 mEq/L (ref 19–32)
Calcium: 9.4 mg/dL (ref 8.4–10.5)
Creatinine, Ser: 0.8 mg/dL (ref 0.40–1.20)
GFR: 96.97 mL/min (ref >60.00)
GLUCOSE: 92 mg/dL (ref 70–99)
POTASSIUM: 3.9 meq/L (ref 3.5–5.1)
SODIUM: 140 meq/L (ref 135–145)
Total Bilirubin: 0.4 mg/dL (ref 0.2–1.2)
Total Protein: 7.7 g/dL (ref 6.0–8.3)

## 2017-10-23 LAB — TSH: TSH: 3.82 u[IU]/mL (ref 0.35–4.50)

## 2017-10-27 ENCOUNTER — Encounter: Payer: 59 | Admitting: Family Medicine

## 2017-10-28 ENCOUNTER — Ambulatory Visit (INDEPENDENT_AMBULATORY_CARE_PROVIDER_SITE_OTHER): Payer: 59 | Admitting: Family Medicine

## 2017-10-28 ENCOUNTER — Encounter: Payer: Self-pay | Admitting: Family Medicine

## 2017-10-28 VITALS — BP 120/78 | HR 76 | Temp 97.9°F | Ht 66.0 in | Wt 223.5 lb

## 2017-10-28 DIAGNOSIS — E0500 Thyrotoxicosis with diffuse goiter without thyrotoxic crisis or storm: Secondary | ICD-10-CM | POA: Diagnosis not present

## 2017-10-28 DIAGNOSIS — I10 Essential (primary) hypertension: Secondary | ICD-10-CM

## 2017-10-28 DIAGNOSIS — Z1211 Encounter for screening for malignant neoplasm of colon: Secondary | ICD-10-CM | POA: Diagnosis not present

## 2017-10-28 DIAGNOSIS — B20 Human immunodeficiency virus [HIV] disease: Secondary | ICD-10-CM

## 2017-10-28 DIAGNOSIS — Z1231 Encounter for screening mammogram for malignant neoplasm of breast: Secondary | ICD-10-CM | POA: Diagnosis not present

## 2017-10-28 DIAGNOSIS — Z0000 Encounter for general adult medical examination without abnormal findings: Secondary | ICD-10-CM | POA: Diagnosis not present

## 2017-10-28 MED ORDER — HYDROCHLOROTHIAZIDE 25 MG PO TABS: ORAL_TABLET | ORAL | 3 refills | Status: AC

## 2017-10-28 NOTE — Patient Instructions (Signed)
Take care of yourself   Stay active  Labs look good   The more exercise the better

## 2017-10-28 NOTE — Progress Notes (Signed)
Subjective:    Patient ID: Carla Thomas, female    DOB: May 05, 1966, 52 y.o.   MRN: [id number]  HPI Here for health maintenance exam and to review chronic medical problems    Has done well  Working and gardening   Has felt fairly well  Taking care of herself   Had some teeth pulled -that went ok  Wt Readings from Last 3 Encounters:  10/28/17 223 lb 8 oz (101.4 kg)  07/17/17 226 lb (102.5 kg)  03/09/17 224 lb 4 oz (101.7 kg)  gardening for exercise - walks a lot at work / also mows twice per week 36.07 kg/m   Pap 2/15  (nl report but had trich), neg HPV Had a hysterectomy  No gyn problems   Mammogram 4/18 -saw asymmetry in the R breast and was called back/follow up nl Needs ref for the breast center  Self breast exam - no lumps   Flu shot 9/18  Tetanus vac 2/16   Colonoscopy 5/18 -adenoma /recall 2023  Continues to see ID for HIV   bp is up on the first check  Better on 2nd check  BP: 120/78   No cp or palpitations or headaches or edema  No side effects to medicines  BP Readings from Last 3 Encounters:  10/28/17 (!) 144/90  07/17/17 128/86  03/09/17 128/76      Hx of Graves dz with goiter No clinical changes  Lab Results  Component Value Date   TSH 3.82 10/23/2017    Hyperlipidemia Lab Results  Component Value Date   CHOL 175 10/23/2017   CHOL 181 12/30/2016   CHOL 201 (H) 10/22/2016   Lab Results  Component Value Date   HDL 47.60 10/23/2017   HDL 47 (L) 12/30/2016   HDL 49.90 10/22/2016   Lab Results  Component Value Date   LDLCALC 114 (H) 10/23/2017   LDLCALC 118 (H) 12/30/2016   LDLCALC 131 (H) 10/22/2016   Lab Results  Component Value Date   TRIG 67.0 10/23/2017   TRIG 78 12/30/2016   TRIG 103.0 10/22/2016   Lab Results  Component Value Date   CHOLHDL 4 10/23/2017   CHOLHDL 3.9 12/30/2016   CHOLHDL 4 10/22/2016   Lab Results  Component Value Date   LDLDIRECT 154.6 12/16/2010  does not eat much fat overall  Eats some  cheese    Smoking status -none !   Going to [place] in Dec 2020  Looking into what imms she needs   Patient Active Problem List   Diagnosis Date Noted  . Screening mammogram, encounter for 10/28/2017  . Lateral epicondylitis 07/17/2017  . Medication monitoring encounter 01/22/2017  . Hx of adenomatous polyp of colon 10/30/2016  . Colon cancer screening 08/18/2016  . Screening examination for venereal disease 11/30/2013  . Encounter for routine gynecological examination 08/01/2013  . Former smoker 06/02/2011  . Lump in neck 02/26/2011  . HIV disease ([place]) 01/05/2011  . Other screening mammogram 11/12/2010  . Hypokalemia 11/07/2010  . Routine general medical examination at a health care facility 11/04/2010  . HYPERCHOLESTEROLEMIA 06/12/2010  . DYSPEPSIA 07/18/2009  . GOITER 10/16/2006  . Graves disease 10/16/2006  . GOUT 10/16/2006  . Anxiety state 10/16/2006  . Essential hypertension 10/16/2006  . Allergic rhinitis 10/16/2006  . ASTHMA 10/16/2006  . Eczema 10/16/2006  . PLANTAR FASCIITIS 10/16/2006   Past Medical History:  Diagnosis Date  . Anemia    iron deficiency  . Anxiety    Patient denies  .  Asthma   . Dehydration   . Gout   . HIV (human immunodeficiency virus infection) ([place])   . Hx of adenomatous polyp of colon 10/30/2016  . Hypertension   . Potassium (K) deficiency   . Thyroid disease    Past Surgical History:  Procedure Laterality Date  . ABDOMINAL HYSTERECTOMY  04/2006   partial for fibroids  . CESAREAN SECTION    . DENTAL SURGERY  09/03/2017   molor and wisdome tooth removed   Social History   Tobacco Use  . Smoking status: Light Tobacco Smoker    Packs/day: 0.25    Types: Cigarettes  . Smokeless tobacco: Never Used  Substance Use Topics  . Alcohol use: No    Alcohol/week: 0.0 oz  . Drug use: No   Family History  Problem Relation Age of Onset  . Cancer Brother        lung CA  . Cancer Maternal Grandmother        breast CA  . Heart  failure Maternal Grandmother   . Colon cancer Neg Hx   . Esophageal cancer Neg Hx   . Rectal cancer Neg Hx   . Stomach cancer Neg Hx    Allergies  Allergen Reactions  . Latex Anaphylaxis  . Codeine     REACTION: headaches  . Shellfish Allergy     Irritates gout  . Varenicline Tartrate     Bad dreams  . Vitamin E     Hair loss  . Amoxicillin Rash    Has patient had a PCN reaction causing immediate rash, facial/tongue/throat swelling, SOB or lightheadedness with hypotension: No Has patient had a PCN reaction causing severe rash involving mucus membranes or skin necrosis: No Has patient had a PCN reaction that required hospitalization No Has patient had a PCN reaction occurring within the last 10 years: yes.Marland Kitchen5-6 years ago If all of the above answers are "NO", then may proceed with Cephalosporin use.     Current Outpatient Medications on File Prior to Visit  Medication Sig Dispense Refill  . acetaminophen (TYLENOL) 500 MG tablet Take 1,000 mg by mouth every 6 (six) hours as needed for pain.    . calcium carbonate (OS-CAL - DOSED IN MG OF ELEMENTAL CALCIUM) 1250 MG tablet Take 1 tablet by mouth 2 (two) times daily.     . cholecalciferol (VITAMIN D) 400 UNITS TABS Take 400 Units by mouth daily.      . clobetasol ointment (TEMOVATE) 9.76 % APPLY 1 APPLICATION TOPICALLY TO AFFECTED AREA TWICE A DAY AS NEEDED 75 g 1  . emtricitabine-rilpivir-tenofovir AF (ODEFSEY) 200-25-25 MG TABS tablet Take 1 tablet by mouth daily with breakfast. 90 tablet 3  . fluticasone (FLONASE) 50 MCG/ACT nasal spray Place 2 sprays into both nostrils daily. 16 g 11  . meloxicam (MOBIC) 7.5 MG tablet Take 1 tablet (7.5 mg total) by mouth daily as needed for pain. With food 30 tablet 0   Current Facility-Administered Medications on File Prior to Visit  Medication Dose Route Frequency Provider Last Rate Last Dose  . 0.9 %  sodium chloride infusion  500 mL Intravenous Continuous Gatha Mayer, MD        Review of  Systems  Constitutional: Negative for activity change, appetite change, fatigue, fever and unexpected weight change.  HENT: Negative for congestion, ear pain, rhinorrhea, sinus pressure and sore throat.   Eyes: Negative for pain, redness and visual disturbance.  Respiratory: Negative for cough, shortness of breath and wheezing.  Cardiovascular: Negative for chest pain and palpitations.  Gastrointestinal: Negative for abdominal pain, blood in stool, constipation and diarrhea.  Endocrine: Negative for polydipsia and polyuria.  Genitourinary: Negative for dysuria, frequency and urgency.  Musculoskeletal: Negative for arthralgias, back pain and myalgias.  Skin: Negative for pallor and rash.  Allergic/Immunologic: Negative for environmental allergies.  Neurological: Negative for dizziness, syncope and headaches.  Hematological: Negative for adenopathy. Does not bruise/bleed easily.  Psychiatric/Behavioral: Negative for decreased concentration and dysphoric mood. The patient is not nervous/anxious.        Objective:   Physical Exam  Constitutional: She appears well-developed and well-nourished. No distress.  obese and well appearing   HENT:  Head: Normocephalic and atraumatic.  Right Ear: External ear normal.  Left Ear: External ear normal.  Mouth/Throat: Oropharynx is clear and moist.  Eyes: Pupils are equal, round, and reactive to light. Conjunctivae and EOM are normal. No scleral icterus.  Neck: Normal range of motion. Neck supple. No JVD present. Carotid bruit is not present. Thyromegaly present.  No change in thyroid exam   Cardiovascular: Normal rate, regular rhythm, normal heart sounds and intact distal pulses. Exam reveals no gallop.  Pulmonary/Chest: Effort normal and breath sounds normal. No respiratory distress. She has no wheezes. She exhibits no tenderness. No breast tenderness, discharge or bleeding.  Good air exch   Abdominal: Soft. Bowel sounds are normal. She exhibits no  distension, no abdominal bruit and no mass. There is no tenderness.  Genitourinary: No breast tenderness, discharge or bleeding.  Genitourinary Comments: Breast exam: No mass, nodules, thickening, tenderness, bulging, retraction, inflamation, nipple discharge or skin changes noted.  No axillary or clavicular LA.      Musculoskeletal: Normal range of motion. She exhibits no edema or tenderness.  Lymphadenopathy:    She has no cervical adenopathy.  Neurological: She is alert. She has normal reflexes. She displays normal reflexes. No cranial nerve deficit. She exhibits normal muscle tone. Coordination normal.  Skin: Skin is warm and dry. No rash noted. No erythema. No pallor.  Few lentigines and skin tags   Psychiatric: She has a normal mood and affect.  Pleasant           Assessment & Plan:   Problem List Items Addressed This Visit      Cardiovascular and Mediastinum   Essential hypertension    bp in fair control at this time  BP Readings from Last 1 Encounters:  10/28/17 120/78   No changes needed Most recent labs reviewed  Disc lifstyle change with low sodium diet and exercise        Relevant Medications   hydrochlorothiazide (HYDRODIURIL) 25 MG tablet     Endocrine   Graves disease     Pt has no clinical changes No change in energy level/ hair or skin/ edema and no tremor Lab Results  Component Value Date   TSH 3.82 10/23/2017            Other   Colon cancer screening    Colonoscopy 5/18 with adenoma  Recall 2023      HIV disease ([place])    Doing well with ID flu  No change in tx       Routine general medical examination at a health care facility - Primary    Reviewed health habits including diet and exercise and skin cancer prevention Reviewed appropriate screening tests for age  Also reviewed health mt list, fam hx and immunization status , as well as social and family history  See HPI Labs reviewed  Enc wt loss  Enc more exercise       Screening  mammogram, encounter for    Scheduled annual screening mammogram Nl breast exam today  Encouraged monthly self exams        Relevant Orders   MM DIGITAL SCREENING BILATERAL

## 2017-10-29 NOTE — Assessment & Plan Note (Signed)
bp in fair control at this time  BP Readings from Last 1 Encounters:  10/28/17 120/78   No changes needed Most recent labs reviewed  Disc lifstyle change with low sodium diet and exercise

## 2017-10-29 NOTE — Assessment & Plan Note (Signed)
Doing well with ID flu  No change in tx

## 2017-10-29 NOTE — Assessment & Plan Note (Signed)
Scheduled annual screening mammogram Nl breast exam today  Encouraged monthly self exams   

## 2017-10-29 NOTE — Assessment & Plan Note (Signed)
Reviewed health habits including diet and exercise and skin cancer prevention Reviewed appropriate screening tests for age  Also reviewed health mt list, fam hx and immunization status , as well as social and family history   See HPI Labs reviewed  Enc wt loss  Enc more exercise

## 2017-10-29 NOTE — Assessment & Plan Note (Signed)
Colonoscopy 5/18 with adenoma  Recall 2023

## 2017-10-29 NOTE — Assessment & Plan Note (Signed)
  Pt has no clinical changes No change in energy level/ hair or skin/ edema and no tremor Lab Results  Component Value Date   TSH 3.82 10/23/2017

## 2017-11-18 ENCOUNTER — Ambulatory Visit: Payer: 59

## 2017-12-21 ENCOUNTER — Ambulatory Visit
Admission: RE | Admit: 2017-12-21 | Discharge: 2017-12-21 | Disposition: A | Discharge status: Home / Self Care | Payer: 59 | Source: Ambulatory Visit | Attending: Family Medicine | Admitting: Family Medicine

## 2017-12-21 DIAGNOSIS — Z1231 Encounter for screening mammogram for malignant neoplasm of breast: Secondary | ICD-10-CM

## 2018-01-11 ENCOUNTER — Ambulatory Visit: Payer: 59 | Admitting: Family Medicine

## 2018-01-11 ENCOUNTER — Encounter: Payer: Self-pay | Admitting: Family Medicine

## 2018-01-11 VITALS — BP 124/82 | HR 72 | Temp 98.5°F | Ht 66.0 in | Wt 224.0 lb

## 2018-01-11 DIAGNOSIS — G8929 Other chronic pain: Secondary | ICD-10-CM | POA: Diagnosis not present

## 2018-01-11 DIAGNOSIS — M545 Low back pain, unspecified: Secondary | ICD-10-CM | POA: Insufficient documentation

## 2018-01-11 DIAGNOSIS — M6740 Ganglion, unspecified site: Secondary | ICD-10-CM | POA: Diagnosis not present

## 2018-01-11 NOTE — Progress Notes (Signed)
Subjective:    Patient ID: Carla Thomas, female    DOB: 1966-05-25, 52 y.o.   MRN: [id number]  HPI  Here for R arm and back pain    Interested in [place] benefits She was in the service [employer])  acquired injuries in the service   Plantar fasciitis  Flat feet - was on feet a lot/marching and pulling duty   Had jungle rot (feet exposed to water- stay wet for long period of time)  Back injury  [place] [address] down a hill and had to go on sick call  Sprained her back / muscle pain chronic  ? 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  Also excess push ups   Depression and anxiety  Unsure if she has PTSD    R arm problem and ganglion cyst are biggest problem  Some days it is hard to use  Ganglion cyst - ruptures and it puts pressure on hand  Then it comes back (every time) - within a month or two  occ finger tips feel a little numb   She wears a forearm band and it helps the hand pain  (elbow sometimes bothers her)  No neck pain   Tinnitus (may need a hearing aide)  More on L than right     Wt Readings from Last 3 Encounters:  01/11/18 224 lb (101.6 kg)  10/28/17 223 lb 8 oz (101.4 kg)  07/17/17 226 lb (102.5 kg)   36.15 kg/m   Patient Active Problem List   Diagnosis Date Noted  . Low back pain 01/11/2018  . Ganglion cyst 01/11/2018  . Screening mammogram, encounter for 10/28/2017  . Medication monitoring encounter 01/22/2017  . Hx of adenomatous polyp of colon 10/30/2016  . Colon cancer screening 08/18/2016  . Screening examination for venereal disease 11/30/2013  . Encounter for routine gynecological examination 08/01/2013  . Former smoker 06/02/2011  . HIV disease ([place]) 01/05/2011  . Hypokalemia 11/07/2010  . Routine general  medical examination at a health care facility 11/04/2010  . HYPERCHOLESTEROLEMIA 06/12/2010  . DYSPEPSIA 07/18/2009  . GOITER 10/16/2006  . Graves disease 10/16/2006  . GOUT 10/16/2006  . Essential hypertension 10/16/2006  . Allergic rhinitis 10/16/2006  . ASTHMA 10/16/2006  . Eczema 10/16/2006   Past Medical History:  Diagnosis Date  . Anemia    iron deficiency  . Anxiety    Patient denies  . Asthma   . Dehydration   . Gout   . HIV (human immunodeficiency virus infection) ([place])   . Hx of adenomatous polyp of colon 10/30/2016  . Hypertension   . Potassium (K) deficiency   . Thyroid disease    Past Surgical History:  Procedure Laterality Date  . ABDOMINAL HYSTERECTOMY  04/2006   partial for fibroids  . CESAREAN SECTION    . DENTAL SURGERY  09/03/2017   molor and wisdome tooth removed   Social History   Tobacco Use  . Smoking status: Light Tobacco Smoker    Packs/day: 0.25    Types: Cigarettes  . Smokeless tobacco: Never Used  Substance Use Topics  . Alcohol use: No    Alcohol/week: 0.0 oz  . Drug  use: No   Family History  Problem Relation Age of Onset  . Cancer Brother        lung CA  . Cancer Maternal Grandmother        breast CA  . Heart failure Maternal Grandmother   . Breast cancer Maternal Grandmother 79  . Colon cancer Neg Hx   . Esophageal cancer Neg Hx   . Rectal cancer Neg Hx   . Stomach cancer Neg Hx    Allergies  Allergen Reactions  . Latex Anaphylaxis  . Codeine     REACTION: headaches  . Shellfish Allergy     Irritates gout  . Varenicline Tartrate     Bad dreams  . Vitamin E     Hair loss  . Amoxicillin Rash    Has patient had a PCN reaction causing immediate rash, facial/tongue/throat swelling, SOB or lightheadedness with hypotension: No Has patient had a PCN reaction causing severe rash involving mucus membranes or skin necrosis: No Has patient had a PCN reaction that required hospitalization No Has patient had a PCN reaction  occurring within the last 10 years: yes.Marland Kitchen5-6 years ago If all of the above answers are "NO", then may proceed with Cephalosporin use.     Current Outpatient Medications on File Prior to Visit  Medication Sig Dispense Refill  . acetaminophen (TYLENOL) 500 MG tablet Take 1,000 mg by mouth every 6 (six) hours as needed for pain.    . calcium carbonate (OS-CAL - DOSED IN MG OF ELEMENTAL CALCIUM) 1250 MG tablet Take 1 tablet by mouth 2 (two) times daily.     . cholecalciferol (VITAMIN D) 400 UNITS TABS Take 400 Units by mouth daily.      . clobetasol ointment (TEMOVATE) 8.18 % APPLY 1 APPLICATION TOPICALLY TO AFFECTED AREA TWICE A DAY AS NEEDED 75 g 1  . emtricitabine-rilpivir-tenofovir AF (ODEFSEY) 200-25-25 MG TABS tablet Take 1 tablet by mouth daily with breakfast. 90 tablet 3  . fluticasone (FLONASE) 50 MCG/ACT nasal spray Place 2 sprays into both nostrils daily. 16 g 11  . hydrochlorothiazide (HYDRODIURIL) 25 MG tablet TAKE 1 TABLET EVERY MORNING 90 tablet 3  . meloxicam (MOBIC) 7.5 MG tablet Take 1 tablet (7.5 mg total) by mouth daily as needed for pain. With food 30 tablet 0   Current Facility-Administered Medications on File Prior to Visit  Medication Dose Route Frequency Provider Last Rate Last Dose  . 0.9 %  sodium chloride infusion  500 mL Intravenous Continuous Gatha Mayer, MD         Review of Systems  Constitutional: Negative for activity change, appetite change, fatigue, fever and unexpected weight change.  HENT: Negative for congestion, ear pain, rhinorrhea, sinus pressure and sore throat.   Eyes: Negative for pain, redness and visual disturbance.  Respiratory: Negative for cough, shortness of breath and wheezing.   Cardiovascular: Negative for chest pain and palpitations.  Gastrointestinal: Negative for abdominal pain, blood in stool, constipation and diarrhea.  Endocrine: Negative for polydipsia and polyuria.  Genitourinary: Negative for dysuria, frequency and urgency.    Musculoskeletal: Positive for back pain. Negative for arthralgias and myalgias.       Foot pain bilat - flat feet and plantar fasciitis   Low back pain on L  Skin: Negative for pallor and rash.  Allergic/Immunologic: Negative for environmental allergies.  Neurological: Negative for dizziness, syncope and headaches.       Cyst on R wrist causing pain and trouble with grip   Hematological:  Negative for adenopathy. Does not bruise/bleed easily.  Psychiatric/Behavioral: Negative for decreased concentration and dysphoric mood. The patient is not nervous/anxious.        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 No distress.  obese and well appearing   HENT:  Head: Normocephalic and atraumatic.  Mouth/Throat: Oropharynx is clear and moist.  Eyes: Pupils are equal, round, and reactive to light. Conjunctivae and EOM are normal.  Neck: Normal range of motion. Neck supple. No JVD present. Carotid bruit is not present. No thyromegaly present.  Nl rom   Cardiovascular: Normal rate, regular rhythm, normal heart sounds and intact distal pulses. Exam reveals no gallop.  Pulmonary/Chest: Effort normal and breath sounds normal. No respiratory distress. She has no wheezes. She has no rales.  No crackles  Abdominal: Soft. Bowel sounds are normal. She exhibits no distension, no abdominal bruit and no mass. There is no tenderness.  Musculoskeletal: She exhibits tenderness. She exhibits no edema.       Lumbar back: She exhibits tenderness and bony tenderness. She exhibits normal range of motion, no swelling and no deformity.  R lumbar musculature is tight an tender  Mild lower lumbar spine tenderness Nl rom /no crepitus  Nl gait  Neg slr  No neuro changes    R wrist-nl rom w/o crepitus or swelling  Small prominence on mid dorsal wrist is mobile and soft (mildly tender)  Nl rom  elbow Nl grip today   Lymphadenopathy:    She has no cervical adenopathy.  Neurological: She is alert. She has normal strength and normal reflexes. She displays no atrophy and normal reflexes. No cranial nerve deficit or sensory deficit. She exhibits normal muscle tone. Coordination and gait normal.  Skin: Skin is warm and dry. No rash noted.  Psychiatric: She has a normal mood and affect.  Pleasant           Assessment & Plan:   Problem List Items Addressed This Visit      Other   Ganglion cyst    Chronic/recurrent - dorsal R wrist  Painful at times and interferes with grip  Pain goes up to elbow- ? If other cause as well  Re assuring exam  Ref to hand specialist        Relevant Orders   Ambulatory referral to Orthopedic Surgery   Low back pain - Primary    Ongoing ever since a fall in the [place]  R foot feels weak when it acts up / pain is also primarily on the R  Will plan xray (she could not stay for that today and wants to return Wednesday)  nsaid prn  Enc stretching/walking PT may help  Pending result       Relevant Orders   DG Lumbar Spine Complete

## 2018-01-11 NOTE — Assessment & Plan Note (Signed)
Ongoing ever since a fall in the [place]  R foot feels weak when it acts up / pain is also primarily on the R  Will plan xray (she could not stay for that today and wants to return Wednesday)  nsaid prn  Enc stretching/walking PT may help  Pending result

## 2018-01-11 NOTE — Patient Instructions (Addendum)
Xray of lumbar spine Wednesday   We will refer you to a hand specialist for the ganglion cyst and hand pain

## 2018-01-11 NOTE — Assessment & Plan Note (Signed)
Chronic/recurrent - dorsal R wrist  Painful at times and interferes with grip  Pain goes up to elbow- ? If other cause as well  Re assuring exam  Ref to hand specialist

## 2018-01-13 ENCOUNTER — Ambulatory Visit (INDEPENDENT_AMBULATORY_CARE_PROVIDER_SITE_OTHER)
Admission: RE | Admit: 2018-01-13 | Discharge: 2018-01-13 | Disposition: A | Discharge status: Home / Self Care | Payer: 59 | Source: Ambulatory Visit | Attending: Family Medicine | Admitting: Family Medicine

## 2018-01-13 ENCOUNTER — Other Ambulatory Visit: Payer: 59

## 2018-01-13 DIAGNOSIS — G8929 Other chronic pain: Secondary | ICD-10-CM | POA: Diagnosis not present

## 2018-01-13 DIAGNOSIS — Z113 Encounter for screening for infections with a predominantly sexual mode of transmission: Secondary | ICD-10-CM

## 2018-01-13 DIAGNOSIS — B20 Human immunodeficiency virus [HIV] disease: Secondary | ICD-10-CM

## 2018-01-13 DIAGNOSIS — M545 Low back pain, unspecified: Secondary | ICD-10-CM

## 2018-01-15 ENCOUNTER — Telehealth: Payer: Self-pay | Admitting: Family Medicine

## 2018-01-15 NOTE — Telephone Encounter (Signed)
Pt given results and documented in result note 

## 2018-01-15 NOTE — Telephone Encounter (Signed)
Pt called to get results.    Copied from [place] [id number]. Topic: Quick Communication - Other Results >> Jan 14, 2018 10:26 AM Tammi Sou, CMA wrote: Called patient to inform them of xray results. When patient returns call, triage nurse may disclose results and Dr. Marliss Coots comments off of mychart and see if pt agrees with PT  Dr. Marliss Coots comments on mychart:   Your xray shows evidence of some mild degenerative disc disease in low back that could cause your pain  Conservative care includes physical therapy- it would help  My assistant will call you to see if you want [place] to set it up

## 2018-01-17 ENCOUNTER — Telehealth: Payer: Self-pay | Admitting: Family Medicine

## 2018-01-17 DIAGNOSIS — G8929 Other chronic pain: Secondary | ICD-10-CM

## 2018-01-17 DIAGNOSIS — M545 Low back pain: Principal | ICD-10-CM

## 2018-01-17 NOTE — Telephone Encounter (Signed)
Referral done Will route to [facility]  

## 2018-01-18 LAB — HIV-1 RNA QUANT-NO REFLEX-BLD
HIV 1 RNA Quant: <20 copies/mL
HIV-1 RNA Quant, Log: <1.3 Log copies/mL

## 2018-01-18 LAB — RPR TITER: RPR Titer: 1:1 {titer} — ABNORMAL HIGH

## 2018-01-18 LAB — FLUORESCENT TREPONEMAL AB(FTA)-IGG-BLD: Fluorescent Treponemal ABS: REACTIVE — AB

## 2018-01-18 LAB — RPR: RPR: REACTIVE — AB

## 2018-01-19 DIAGNOSIS — M7711 Lateral epicondylitis, right elbow: Secondary | ICD-10-CM | POA: Diagnosis not present

## 2018-01-19 DIAGNOSIS — M67431 Ganglion, right wrist: Secondary | ICD-10-CM | POA: Diagnosis not present

## 2018-01-21 ENCOUNTER — Other Ambulatory Visit: Payer: Self-pay | Admitting: Orthopedic Surgery

## 2018-01-21 DIAGNOSIS — M67431 Ganglion, right wrist: Secondary | ICD-10-CM

## 2018-01-27 ENCOUNTER — Ambulatory Visit (INDEPENDENT_AMBULATORY_CARE_PROVIDER_SITE_OTHER): Payer: 59 | Admitting: Licensed Clinical Social Worker

## 2018-01-27 ENCOUNTER — Ambulatory Visit: Payer: 59 | Admitting: Internal Medicine

## 2018-01-27 ENCOUNTER — Encounter: Payer: Self-pay | Admitting: Internal Medicine

## 2018-01-27 VITALS — BP 139/92 | HR 80 | Temp 97.9°F | Ht 66.0 in | Wt 221.0 lb

## 2018-01-27 DIAGNOSIS — B20 Human immunodeficiency virus [HIV] disease: Secondary | ICD-10-CM

## 2018-01-27 DIAGNOSIS — F4321 Adjustment disorder with depressed mood: Secondary | ICD-10-CM | POA: Diagnosis not present

## 2018-01-27 DIAGNOSIS — Z113 Encounter for screening for infections with a predominantly sexual mode of transmission: Secondary | ICD-10-CM | POA: Diagnosis not present

## 2018-01-27 DIAGNOSIS — Z5181 Encounter for therapeutic drug level monitoring: Secondary | ICD-10-CM

## 2018-01-27 DIAGNOSIS — Z23 Encounter for immunization: Secondary | ICD-10-CM | POA: Diagnosis not present

## 2018-01-27 NOTE — Progress Notes (Signed)
CC: Follow up for HIV  Interval history: Currently is asymptomatic and well-controlled on Odefsey.  Has no associated n/v/d.  Denies any missed doses. CD4 not done this time, viral load remains <20.  Continues to be active with gardening.  RPR 1:1 reflective of old, treated disease.    Prior to Admission medications   Medication Sig Start Date End Date Taking? Authorizing Provider  acetaminophen (TYLENOL) 500 MG tablet Take 1,000 mg by mouth every 6 (six) hours as needed for pain.   Yes Historical Provider, MD  calcium carbonate (OS-CAL - DOSED IN MG OF ELEMENTAL CALCIUM) 1250 MG tablet Take 1 tablet by mouth 2 (two) times daily.    Yes Historical Provider, MD  cholecalciferol (VITAMIN D) 400 UNITS TABS Take 400 Units by mouth daily.     Yes Historical Provider, MD  Emtricitab-Rilpivir-Tenofov DF 200-25-300 MG TABS Take 1 tablet by mouth every evening. 10/11/14  Yes Thayer Headings, MD  fluticasone (FLONASE) 50 MCG/ACT nasal spray Place 2 sprays into both nostrils daily. 02/13/15  Yes Abner Greenspan, MD  hydrochlorothiazide (HYDRODIURIL) 25 MG tablet TAKE 1 TABLET EVERY MORNING 08/07/14  Yes Abner Greenspan, MD    Review of Systems Constitutional: negative for fatigue and malaise Gastrointestinal: negative for diarrhea All other systems reviewed and are negative   Physical Exam: CONSTITUTIONAL:in no apparent distress and alert  Vitals:   01/27/18 1047  BP: (!) 139/92  Pulse: 80  Temp: 97.9 F (36.6 C)   Eyes: anicteric HENT: no thrush, no cervical lymphadenopathy Respiratory: Normal respiratory effort; CTA B Skin: some hyperpigmented areas on right lower leg, no active infection  Lab Results  Component Value Date   HIV1RNAQUANT <20 NOT DETECTED 01/13/2018   HIV1RNAQUANT <20 NOT DETECTED 12/30/2016   HIV1RNAQUANT <20 10/18/2015   No components found for: HIV1GENOTYPRPLUS No components found for: THELPERCELL  SH: no tobacco, former smoker

## 2018-01-27 NOTE — Assessment & Plan Note (Signed)
Creat wnl last check by Dr. Glori Bickers.  Continue yearly check by her PCP.

## 2018-01-27 NOTE — Addendum Note (Signed)
Addended by: Eugenia Mcalpine on: 01/27/2018 11:41 AM   Modules accepted: Orders

## 2018-01-27 NOTE — Assessment & Plan Note (Signed)
RPR noted and c/w old disease that was treated.

## 2018-01-27 NOTE — Assessment & Plan Note (Signed)
Doing well with a suppressed virus.  CD4 not done this year but I don't anticipate any issues.  Will check it next year.

## 2018-01-27 NOTE — BH Specialist Note (Signed)
Integrated Behavioral Health Initial Visit  MRN: [id number] Name: Carla Thomas  Number of [facility] Clinician visits:: 1/6 Session Start time: 11:43am  Session End time:  12:13pm Total time: 30 minutes  Type of Service: [place] Interpretor:No. Interpretor Name and Language: n/a   Warm Hand Off Completed.       SUBJECTIVE: Carla Thomas is a 52 y.o. female accompanied by self Patient was referred by Dr. Linus Salmons for depressive symptoms. Patient reports the following symptoms/concerns: stress regarding her brother living with her, anger, lack of happiness, lonelieness, sad mood, crying spells. Duration of problem: unknown; Severity of problem: moderate  OBJECTIVE: Mood: Depressed and Affect: Appropriate  LIFE CONTEXT: Patient has raised 7 children (2 bio, 1 step, 3 godchildren, 1 niece) and is divorced after a 73 year marriage. 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 Patient reports that her brother has lived with her for 5 years now and has a substance dependence problem. She states that he gets $350 a month which he contributes to the household, and that he lives mostly in her lower level of her house. Patient indicates that she has 8 other siblings, but "no one will help" with brother as long as he is drinking. She indicates that she takes responsibility for him because she is honoring her mother's wish that they stick together. Patient reports that her mother was very laid back and content to be a housewife, while her father had great work ethic (although he drank too much). She indicates that she has the laid-back way of her mother with the strong work ethic of her father, but her brother has neither. Patient reports that her brother acts as though he is in charge of the house, and that he treats her as though she is still a child. Her adult children have told her they do  not come over anymore because he makes them feel uncomfortable or asks them for things.   GOALS ADDRESSED: Patient will: 1. Reduce symptoms of: depression  INTERVENTIONS: Interventions utilized: Motivational Interviewing, Brief CBT and Supportive Counseling   ASSESSMENT: Patient currently experiencing stress in relationship with brother and other siblings, irritability, discontent, depressed mood, crying spells, excessively guilty feelings. Her symptoms do not rise to the level of a depressive disorder, as she denies that she has these mood symptoms consistently throughout the day more days than not. However, the depressive symptoms are a part of her difficulty in adjusting to changes and stressors in her relationship with her siblings, especially the brother who lives with her. Therefore, patient's symptoms are most consistent with Adjustment Disorder with Depressed Mood. Counselor guided patient to explore her thoughts and feelings about sibling relationships. Patient reported that none of her siblings will help with brother, but with more clarification stated that several of them have said brother can stay with them, but only if he does not drink. Counselor pointed out that this is not abandoning or neglecting brother, but holding him to a standard required to coexist in their space with them. Counselor explored with patient the root of her desire to help brother without imposing limits. Patient  Indicates that her mother always knew he was one who would struggle and emphasized to patient that she wanted the siblings to help him. Counselor guided patient in functional analysis of her behaviors toward brother. With some coaching, patient was able to identify that her behaviors may not be helping brother  so much as enabling him. Patient states she has asked brother whether he does not want better for himself, but brother deflected and would not give a real answer. Counselor challenged patient on whether  she can make brother want more if he is comfortable with the way things are. Patient and counselor discussed the importance of patient taking care of herself. Counselor emphasized that brother is making his own decisions, and patient must recognize that and make her own. Patient indicated that she feels like she has worked hard in her life and raised her children, so she should not have to deal with an adult child (4 years her senior) as she is about to move into her retirement years. Counselor and patient explored ways for patient to set and keep boundaries with brother. Counselor provided patient with Wellness Dimensions wheel, and encouraged her to identify how specific stressors (I.e., brother) is impacting various dimensions of her wellness. Counselor and patient discussed ways to find balance and set boundaries in various wellness dimensions.   Patient may benefit from ongoing CBT and Reality Therapy.  PLAN: 1. Patient will contact counselor to schedule follow up  Lillie Fragmin, LCSW

## 2018-01-27 NOTE — Progress Notes (Signed)
Prevnar -13 administered to L deltoid, patient tolerated well. Patient also met with Rollene Fare today for counseling services. S.Derrill Bagnell, LPN

## 2018-01-28 DIAGNOSIS — M7711 Lateral epicondylitis, right elbow: Secondary | ICD-10-CM | POA: Diagnosis not present

## 2018-01-28 DIAGNOSIS — M25521 Pain in right elbow: Secondary | ICD-10-CM | POA: Diagnosis not present

## 2018-02-24 ENCOUNTER — Other Ambulatory Visit: Payer: Self-pay | Admitting: *Deleted

## 2018-02-24 DIAGNOSIS — B20 Human immunodeficiency virus [HIV] disease: Secondary | ICD-10-CM

## 2018-02-24 MED ORDER — EMTRICITAB-RILPIVIR-TENOFOV AF 200-25-25 MG PO TABS: 1.0000 | ORAL_TABLET | Freq: Every day | ORAL | 2 refills | Status: AC

## 2018-04-08 ENCOUNTER — Ambulatory Visit (INDEPENDENT_AMBULATORY_CARE_PROVIDER_SITE_OTHER): Payer: 59

## 2018-04-08 DIAGNOSIS — Z23 Encounter for immunization: Secondary | ICD-10-CM

## 2018-09-07 ENCOUNTER — Telehealth: Payer: Self-pay | Admitting: Behavioral Health

## 2018-09-07 NOTE — Telephone Encounter (Addendum)
Patient called and wanted DR. Comer to be made aware that she will now see ID at the [facility].  She is now a disabled [facility] and has to receive care at the [place].  Patient states she sees the [facility] [place] ID team 09/15/2018. Pricilla Riffle RN

## 2018-10-28 ENCOUNTER — Telehealth: Payer: Self-pay | Admitting: Radiology

## 2018-10-28 NOTE — Telephone Encounter (Signed)
done

## 2018-10-28 NOTE — Telephone Encounter (Signed)
Patient called to cancel appts, disabled [facility] and has transfered total care to the [place]

## 2018-10-28 NOTE — Telephone Encounter (Signed)
Thanks for the heads up  Please take me off as her PCP

## 2018-11-02 ENCOUNTER — Other Ambulatory Visit: Payer: 59

## 2018-11-05 ENCOUNTER — Encounter: Payer: 59 | Admitting: Family Medicine

## 2019-01-17 ENCOUNTER — Other Ambulatory Visit: Payer: 59

## 2019-01-31 ENCOUNTER — Encounter: Payer: 59 | Admitting: Internal Medicine

## 2021-12-17 ENCOUNTER — Encounter: Payer: Self-pay | Admitting: Internal Medicine
